# Patient Record
Sex: Female | Born: 1970
Health system: Southern US, Community
[De-identification: ages and names within clinical notes are randomized; demographics above are authoritative.]

## PROBLEM LIST (undated history)

## (undated) ENCOUNTER — Emergency Department (HOSPITAL_COMMUNITY): Payer: Managed Care, Other (non HMO)

## (undated) DIAGNOSIS — J45909 Unspecified asthma, uncomplicated: Secondary | ICD-10-CM

## (undated) HISTORY — PX: HERNIA REPAIR: SHX51

---

## 2000-08-29 ENCOUNTER — Other Ambulatory Visit: Admission: RE | Admit: 2000-08-29 | Discharge: 2000-08-29 | Payer: Self-pay | Admitting: Obstetrics and Gynecology

## 2013-09-23 ENCOUNTER — Emergency Department (HOSPITAL_COMMUNITY)
Admission: EM | Admit: 2013-09-23 | Discharge: 2013-09-24 | Disposition: A | Discharge status: Home / Self Care | Payer: BC Managed Care – PPO | Attending: Emergency Medicine | Admitting: Emergency Medicine

## 2013-09-23 ENCOUNTER — Encounter (HOSPITAL_COMMUNITY): Payer: Self-pay | Admitting: Emergency Medicine

## 2013-09-23 DIAGNOSIS — S161XXA Strain of muscle, fascia and tendon at neck level, initial encounter: Secondary | ICD-10-CM

## 2013-09-23 DIAGNOSIS — Z79899 Other long term (current) drug therapy: Secondary | ICD-10-CM | POA: Insufficient documentation

## 2013-09-23 DIAGNOSIS — S43402A Unspecified sprain of left shoulder joint, initial encounter: Secondary | ICD-10-CM

## 2013-09-23 DIAGNOSIS — J45909 Unspecified asthma, uncomplicated: Secondary | ICD-10-CM | POA: Insufficient documentation

## 2013-09-23 DIAGNOSIS — Z888 Allergy status to other drugs, medicaments and biological substances status: Secondary | ICD-10-CM | POA: Insufficient documentation

## 2013-09-23 DIAGNOSIS — Z91018 Allergy to other foods: Secondary | ICD-10-CM | POA: Insufficient documentation

## 2013-09-23 DIAGNOSIS — Y939 Activity, unspecified: Secondary | ICD-10-CM | POA: Insufficient documentation

## 2013-09-23 DIAGNOSIS — T148XXA Other injury of unspecified body region, initial encounter: Secondary | ICD-10-CM

## 2013-09-23 DIAGNOSIS — S139XXA Sprain of joints and ligaments of unspecified parts of neck, initial encounter: Secondary | ICD-10-CM | POA: Insufficient documentation

## 2013-09-23 DIAGNOSIS — Z91011 Allergy to milk products: Secondary | ICD-10-CM | POA: Insufficient documentation

## 2013-09-23 DIAGNOSIS — IMO0002 Reserved for concepts with insufficient information to code with codable children: Secondary | ICD-10-CM | POA: Insufficient documentation

## 2013-09-23 DIAGNOSIS — M62838 Other muscle spasm: Secondary | ICD-10-CM

## 2013-09-23 HISTORY — DX: Unspecified asthma, uncomplicated: J45.909

## 2013-09-23 MED ORDER — OXYCODONE-ACETAMINOPHEN 5-325 MG PO TABS
1.0000 | ORAL_TABLET | Freq: Once | ORAL | Status: AC
  Administered 2013-09-23: 1 via ORAL
  Filled 2013-09-23: qty 1
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
## 2013-09-24 ENCOUNTER — Emergency Department (HOSPITAL_COMMUNITY): Payer: BC Managed Care – PPO

## 2013-09-24 MED ORDER — DIAZEPAM 2 MG PO TABS
2.0000 mg | ORAL_TABLET | Freq: Once | ORAL | Status: AC
  Administered 2013-09-24: 2 mg via ORAL
  Filled 2013-09-24: qty 1

## 2013-09-24 MED ORDER — CYCLOBENZAPRINE HCL 10 MG PO TABS: 10.0000 mg | ORAL_TABLET | Freq: Two times a day (BID) | ORAL | Status: DC | PRN

## 2013-09-24 MED ORDER — OXYCODONE-ACETAMINOPHEN 5-325 MG PO TABS
1.0000 | ORAL_TABLET | Freq: Once | ORAL | Status: AC
  Administered 2013-09-24: 1 via ORAL
  Filled 2013-09-24: qty 1

## 2013-09-24 MED ORDER — HYDROCODONE-ACETAMINOPHEN 5-325 MG PO TABS: 1.0000 | ORAL_TABLET | ORAL | Status: DC | PRN
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
## 2014-02-03 ENCOUNTER — Other Ambulatory Visit: Payer: Self-pay | Admitting: Obstetrics and Gynecology

## 2014-02-03 DIAGNOSIS — N6320 Unspecified lump in the left breast, unspecified quadrant: Secondary | ICD-10-CM

## 2014-02-03 DIAGNOSIS — R2232 Localized swelling, mass and lump, left upper limb: Secondary | ICD-10-CM

## 2014-02-11 ENCOUNTER — Other Ambulatory Visit: Payer: BC Managed Care – PPO

## 2014-02-21 ENCOUNTER — Ambulatory Visit
Admission: RE | Admit: 2014-02-21 | Discharge: 2014-02-21 | Disposition: A | Discharge status: Home / Self Care | Payer: 59 | Source: Ambulatory Visit | Attending: Obstetrics and Gynecology | Admitting: Obstetrics and Gynecology

## 2014-02-21 DIAGNOSIS — R2232 Localized swelling, mass and lump, left upper limb: Secondary | ICD-10-CM

## 2014-02-21 DIAGNOSIS — N6320 Unspecified lump in the left breast, unspecified quadrant: Secondary | ICD-10-CM

## 2015-08-04 DIAGNOSIS — K4020 Bilateral inguinal hernia, without obstruction or gangrene, not specified as recurrent: Secondary | ICD-10-CM | POA: Insufficient documentation

## 2015-08-04 DIAGNOSIS — J309 Allergic rhinitis, unspecified: Secondary | ICD-10-CM | POA: Insufficient documentation

## 2015-08-04 DIAGNOSIS — R197 Diarrhea, unspecified: Secondary | ICD-10-CM | POA: Diagnosis not present

## 2015-08-04 DIAGNOSIS — J45909 Unspecified asthma, uncomplicated: Secondary | ICD-10-CM | POA: Insufficient documentation

## 2015-08-04 DIAGNOSIS — K429 Umbilical hernia without obstruction or gangrene: Secondary | ICD-10-CM | POA: Insufficient documentation

## 2015-08-05 DIAGNOSIS — R197 Diarrhea, unspecified: Secondary | ICD-10-CM | POA: Diagnosis not present

## 2016-01-10 DIAGNOSIS — J4540 Moderate persistent asthma, uncomplicated: Secondary | ICD-10-CM | POA: Diagnosis not present

## 2016-01-10 DIAGNOSIS — J302 Other seasonal allergic rhinitis: Secondary | ICD-10-CM | POA: Diagnosis not present

## 2016-01-10 MED FILL — VENTOLIN HFA 90 MCG INHALER: 108 (90 BAS | 25 days supply | Qty: 18 | Fill #0

## 2016-01-10 MED FILL — MONTELUKAST SOD 10 MG TAB: 10 | 30 days supply | Qty: 30 | Fill #0

## 2016-03-28 ENCOUNTER — Telehealth: Payer: Self-pay | Admitting: Family Medicine

## 2016-03-28 MED FILL — predniSONE 20 MG TABS: 20 | 7 days supply | Qty: 7 | Fill #0

## 2016-03-28 NOTE — Telephone Encounter (Signed)
Pt called in never seen dr Katrinka Blazingsmith before.  She is [employer] employee and wanted to know if there is anyway she could be worked in.  Her back has went out and barly can put any weight on it.  1st appt Dr Katrinka Blazingsmith has is the 22nd

## 2016-03-29 DIAGNOSIS — M545 Low back pain: Secondary | ICD-10-CM | POA: Diagnosis not present

## 2016-03-29 DIAGNOSIS — M549 Dorsalgia, unspecified: Secondary | ICD-10-CM | POA: Diagnosis not present

## 2016-03-29 MED FILL — CYCLOBENZAPRINE 10 MG TAB: 10 | 10 days supply | Qty: 30 | Fill #0

## 2016-03-29 MED FILL — OXYCODONE W/APAP 5/325 TAB: 5-325 | 5 days supply | Qty: 20 | Fill #0

## 2016-03-29 NOTE — Telephone Encounter (Signed)
Spoke to pt, scheduled her for 1.9.17 @ 1pm.

## 2016-04-01 NOTE — Progress Notes (Signed)
Tawana ScaleZach Haani Bakula D.O. [facility] [address] [place], [place] [address] Phone: [phone] Subjective:    I'm seeing this patient by the request  of:  BURNETT,BRENT A, MD   CC: Low back pain.  ZHY:[id number]:Subjective  Amber Gill is a 46 y.o. female coming in with complaint of low back pain. Been greater than one week. Patient was seen by primary care provider. Patient was found to have more of a strain. Patient was given some exercises, Flexeril as well as Percocet. Patient states it initially happened when she was picking up a laundry basket and twisting. States that she continued to have constant pain. States that she was having radiation to the left leg as well as the left hip. Rates the severity of pain a 6 out of 10. Seem to catch her breath. Patient states Unfortunately the pain continued and only minorly better with the prednisone. Still has the pain. Any type of flexing seems to get a radiating pain down the lateral aspect of the left leg. Denies any weakness. Has been unable to work secondary to the pain. Patient is taking muscle relaxers a regular basis. Denies any bowel or bladder problems, denies any fevers chills or any abnormal weight loss recently.    Patient did have x-rays taken 03/29/2016. These were independently visualized by me. X-ray show no significant bony abnormality.  Past Medical History:  Diagnosis Date  . Asthma    Past Surgical History:  Procedure Laterality Date  . HERNIA REPAIR     Social History   Social History  . Marital status: Single    Spouse name: N/A  . Number of children: N/A  . Years of education: N/A   Social History Main Topics  . Smoking status: Never Smoker  . Smokeless tobacco: None  . Alcohol use Yes  . Drug use: No  . Sexual activity: Not Asked   Other Topics Concern  . None   Social History Narrative  . None   Allergies  Allergen Reactions  . Casein Diarrhea and Other (See Comments)    Eczema   . Gluten Meal  Diarrhea and Other (See Comments)    eczema  . Milk-Related Compounds Diarrhea and Other (See Comments)    eczema  . Nsaids Other (See Comments)    Severe stomach pains  . Oat Other (See Comments)    eczema  . Ceclor [Cefaclor] Rash   No family history on file. No family history of rheumatological diseases.  Past medical history, social, surgical and family history all reviewed in electronic medical record.  No pertanent information unless stated regarding to the chief complaint.   Review of Systems:Review of systems updated and as accurate as of 04/02/16  No headache, visual changes, nausea, vomiting, diarrhea, constipation, dizziness, abdominal pain, skin rash, fevers, chills, night sweats, weight loss, swollen lymph nodes, chest pain, shortness of breath, mood changes.   Objective  Blood pressure 116/80, height 5\' 5"  (1.651 m), weight 160 lb (72.6 kg). Systems examined below as of 04/02/16   General: No apparent distress alert and oriented x3 mood and affect normal, dressed appropriately.  HEENT: Pupils equal, extraocular movements intact  Respiratory: Patient's speak in full sentences and does not appear short of breath  Cardiovascular: No lower extremity edema, non tender, no erythema  Skin: Warm dry intact with no signs of infection or rash on extremities or on axial skeleton.  Abdomen: Soft nontender  Neuro: Cranial nerves II through XII are intact, neurovascularly intact  in all extremities with 2+ DTRs and 2+ pulses.  Lymph: No lymphadenopathy of posterior or anterior cervical chain or axillae bilaterally.  Gait normal with good balance and coordination.  MSK:  Non tender with full range of motion and good stability and symmetric strength and tone of shoulders, elbows, wrist, hip, knee and ankles bilaterally.  Back Exam:  Inspection: Unremarkable  Motion: Flexion 15 deg, Extension 25 deg, Side Bending to 15 deg bilaterally,  Rotation to 15 deg bilaterally  SLR laying:  Severely positive on the left side XSLR laying: Positive with radicular symptoms on the left side Palpable tenderness: . Severe tenderness to palpation and appears palmar musculature around L4-L5 on the left side FABER: Unable to do secondary to pain. Sensory change: Gross sensation intact to all lumbar and sacral dermatomes.  Reflexes: 2+ at both patellar tendons, 2+ at achilles tendons, Babinski's downgoing.  Strength at foot  5 out of 5 and symmetric    Impression and Recommendations:     This case required medical decision making of moderate complexity.      Note: This dictation was prepared with Dragon dictation along with smaller phrase technology. Any transcriptional errors that result from this process are unintentional.

## 2016-04-02 ENCOUNTER — Encounter: Payer: Self-pay | Admitting: *Deleted

## 2016-04-02 ENCOUNTER — Ambulatory Visit (INDEPENDENT_AMBULATORY_CARE_PROVIDER_SITE_OTHER): Payer: 59 | Admitting: Family Medicine

## 2016-04-02 ENCOUNTER — Encounter: Payer: Self-pay | Admitting: Family Medicine

## 2016-04-02 VITALS — BP 116/80 | Ht 65.0 in | Wt 160.0 lb

## 2016-04-02 DIAGNOSIS — M5440 Lumbago with sciatica, unspecified side: Secondary | ICD-10-CM

## 2016-04-02 DIAGNOSIS — M5416 Radiculopathy, lumbar region: Secondary | ICD-10-CM | POA: Diagnosis not present

## 2016-04-02 MED ORDER — METHYLPREDNISOLONE ACETATE 80 MG/ML IJ SUSP
80.0000 mg | Freq: Once | INTRAMUSCULAR | Status: AC
  Administered 2016-04-02: 80 mg via INTRAMUSCULAR

## 2016-04-02 MED ORDER — DIAZEPAM 5 MG PO TABS: 5.0000 mg | ORAL_TABLET | Freq: Two times a day (BID) | ORAL | 0 refills | Status: DC | PRN

## 2016-04-02 MED ORDER — TRAMADOL HCL 50 MG PO TABS: 50.0000 mg | ORAL_TABLET | Freq: Two times a day (BID) | ORAL | 0 refills | Status: DC | PRN

## 2016-04-02 MED ORDER — KETOROLAC TROMETHAMINE 60 MG/2ML IM SOLN
60.0000 mg | Freq: Once | INTRAMUSCULAR | Status: AC
  Administered 2016-04-02: 60 mg via INTRAMUSCULAR

## 2016-04-02 MED ORDER — GABAPENTIN 100 MG PO CAPS: 200.0000 mg | ORAL_CAPSULE | Freq: Every day | ORAL | 3 refills | Status: DC

## 2016-04-02 MED FILL — GABAPENTIN 100 MG CAPSULE: 100 | 30 days supply | Qty: 60 | Fill #0

## 2016-04-02 MED FILL — traMADol HCL 50 MG TABS: 50 | 15 days supply | Qty: 30 | Fill #0

## 2016-04-02 NOTE — Assessment & Plan Note (Signed)
Patient is more of a lumbar radiculopathy. Positive straight leg test noted today. Radicular symptoms noted. Patient does not have any weakness and constant numbness at this point. Patient did not respond externally well to the prednisone. Having difficulty with the muscle relaxer. Given Valium is a new muscle relaxer, gabapentin as well. We will keep her from heavy lifting over the course the next week. Tramadol for pain relief and tell her to discontinue the Percocet. Follow-up again in 1 week. Worsening symptoms I do feel that advance imaging could be warranted. Patient knows if worsening symptoms before visit patient should seek medical attention immediately.

## 2016-04-02 NOTE — Patient Instructions (Addendum)
Good to see you  Ice 20 minutes 2 times daily. Usually after activity and before bed. 2 injections today  Avoid excessive movement.  Gabapentin 200mg  at night Stop the percocet and try tramadol up to 2 times a day with 650mg  of tylenol.  Gabapentin 200mg  at night See me again in 1 week (OK to double book)

## 2016-04-08 NOTE — Progress Notes (Signed)
Amber ScaleZach Melford Gill D.O. [facility] [address] [place], [place] [address] Phone: [phone] Subjective:    I'm seeing this patient by the request  of:  BURNETT,BRENT A, MD   CC: Low back pain f/u   [name] is a 46 y.o. female coming in with complaint of low back pain. Been greater than one week. Patient was seen by primary care provider. Patient was found to have more of a strain. Patient was given some exercises, Flexeril as well as Percocet. Patient was continuing to have radicular symptoms. Patient was put on gabapentin as well as tramadol at last exam. Patient was to start some mild range of motion exercises. Patient states she seems to be doing much better. No radicular symptoms at this time. Still some mild discomfort of the Pierce palmar musculature but improved overall.     Patient did have x-rays taken 03/29/2016. These were independently visualized by me. X-ray show no significant bony abnormality.  Past Medical History:  Diagnosis Date  . Asthma    Past Surgical History:  Procedure Laterality Date  . HERNIA REPAIR     Social History   Social History  . Marital status: Single    Spouse name: N/A  . Number of children: N/A  . Years of education: N/A   Social History Main Topics  . Smoking status: Never Smoker  . Smokeless tobacco: None  . Alcohol use Yes  . Drug use: No  . Sexual activity: Not Asked   Other Topics Concern  . None   Social History Narrative  . None   Allergies  Allergen Reactions  . Casein Diarrhea and Other (See Comments)    Eczema   . Gluten Meal Diarrhea and Other (See Comments)    eczema  . Milk-Related Compounds Diarrhea and Other (See Comments)    eczema  . Nsaids Other (See Comments)    Severe stomach pains  . Oat Other (See Comments)    eczema  . Ceclor [Cefaclor] Rash   No family history on file. No family history of rheumatological diseases.  Past medical history, social, surgical and  family history all reviewed in electronic medical record.  No pertanent information unless stated regarding to the chief complaint.   Review of Systems:Review of systems updated and as accurate as of 04/09/16  No headache, visual changes, nausea, vomiting, diarrhea, constipation, dizziness, abdominal pain, skin rash, fevers, chills, night sweats, weight loss, swollen lymph nodes, chest pain, shortness of breath, mood changes.   Objective  Blood pressure 118/72, pulse 93, height 5\' 5"  (1.651 m), SpO2 98 %. Systems examined below as of 04/09/16   Systems examined below as of 04/09/16 General: NAD A&O x3 mood, affect normal  HEENT: Pupils equal, extraocular movements intact no nystagmus Respiratory: not short of breath at rest or with speaking Cardiovascular: No lower extremity edema, non tender Skin: Warm dry intact with no signs of infection or rash on extremities or on axial skeleton. Abdomen: Soft nontender, no masses Neuro: Cranial nerves  intact, neurovascularly intact in all extremities with 2+ DTRs and 2+ pulses. Lymph: No lymphadenopathy appreciated today  Gait normal with good balance and coordination.  MSK: Non tender with full range of motion and good stability and symmetric strength and tone of shoulders, elbows, wrist,  knee hips and ankles bilaterally.   Back Exam:  Inspection: Unremarkable  Motion: Flexion 45 deg, Extension 25 deg, Side Bending to 45 deg bilaterally  Rotation to 45 deg bilaterally  SLR laying: Negative  XSLR laying: Negative  Palpable tenderness: Tender to palpation of the paraspinal musculature still noted.Marland Kitchen FABER: negative. Sensory change: Gross sensation intact to all lumbar and sacral dermatomes.  Reflexes: 2+ at both patellar tendons, 2+ at achilles tendons, Babinski's downgoing.  Strength at foot  Plantar-flexion: 5/5 Dorsi-flexion: 5/5 Eversion: 5/5 Inversion: 5/5  Leg strength  Quad: 5/5 Hamstring: 5/5 Hip flexor: 5/5 Hip abductors: 4+/5  Gait  unremarkable.  Osteopathic findings Cervical C2 flexed rotated and side bent right C4 flexed rotated and side bent left C6 flexed rotated and side bent left T3 extended rotated and side bent right inhaled third rib T9 extended rotated and side bent left L2 flexed rotated and side bent right Sacrum right on right    Impression and Recommendations:     This case required medical decision making of moderate complexity.      Note: This dictation was prepared with Dragon dictation along with smaller phrase technology. Any transcriptional errors that result from this process are unintentional.

## 2016-04-09 ENCOUNTER — Ambulatory Visit (INDEPENDENT_AMBULATORY_CARE_PROVIDER_SITE_OTHER): Payer: 59 | Admitting: Family Medicine

## 2016-04-09 ENCOUNTER — Encounter: Payer: Self-pay | Admitting: Family Medicine

## 2016-04-09 VITALS — BP 118/72 | HR 93 | Ht 65.0 in

## 2016-04-09 DIAGNOSIS — M999 Biomechanical lesion, unspecified: Secondary | ICD-10-CM | POA: Diagnosis not present

## 2016-04-09 DIAGNOSIS — M5416 Radiculopathy, lumbar region: Secondary | ICD-10-CM | POA: Diagnosis not present

## 2016-04-09 MED ORDER — VITAMIN D (ERGOCALCIFEROL) 1.25 MG (50000 UNIT) PO CAPS: 50000.0000 [IU] | ORAL_CAPSULE | ORAL | 0 refills | Status: AC

## 2016-04-09 MED FILL — VIT D2 1.25 MG (50,000 UNIT: 1.25 MG | 84 days supply | Qty: 12 | Fill #0

## 2016-04-09 NOTE — Assessment & Plan Note (Signed)
Patient is doing much better at this time. We'll continue to monitor. Started on osteopathic manipulation. Patient also referred to formal physical therapy. We discussed icing regimen. Discussed core stabilization. Patient and will come back and see me again in 4-6 weeks.

## 2016-04-09 NOTE — Patient Instructions (Addendum)
Good to see you  Ice is your friend Once weekly vitamin D  We will have PT call you but start Exercises 3 times a week.  See me again in 3-4 weeks.

## 2016-04-09 NOTE — Assessment & Plan Note (Signed)
Decision today to treat with OMT was based on Physical Exam  After verbal consent patient was treated with HVLA, ME,FPr techniques in cervical, thoracic, lumbar and sacral areas  Patient tolerated the procedure well with improvement in symptoms  Patient given exercises, stretches and lifestyle modifications  See medications in patient instructions if given  Patient will follow up in 3-4 weeks

## 2016-04-12 ENCOUNTER — Ambulatory Visit: Payer: 59 | Attending: Family Medicine | Admitting: Physical Therapy

## 2016-04-12 DIAGNOSIS — G8929 Other chronic pain: Secondary | ICD-10-CM | POA: Insufficient documentation

## 2016-04-12 DIAGNOSIS — R293 Abnormal posture: Secondary | ICD-10-CM | POA: Diagnosis not present

## 2016-04-12 DIAGNOSIS — M545 Low back pain: Secondary | ICD-10-CM | POA: Diagnosis not present

## 2016-04-12 DIAGNOSIS — M62830 Muscle spasm of back: Secondary | ICD-10-CM | POA: Diagnosis not present

## 2016-04-12 NOTE — Therapy (Signed)
[facility] [address] [place], [place], [address] Phone: [phone]   Fax:  [phone]  Physical Therapy Evaluation  Patient Details  Name: Amber Gill MRN: [id number] Date of Birth: [birth year] Referring Provider: Antoine PrimasZachary Smith DO  Encounter Date: 04/12/2016      PT End of Session - 04/12/16 1214    Visit Number 1   Number of Visits 13   Date for PT Re-Evaluation 05/24/16   PT Start Time 0930   PT Stop Time 1017   PT Time Calculation (min) 47 min   Activity Tolerance Patient tolerated treatment well   Behavior During Therapy [facility] for tasks assessed/performed      Past Medical History:  Diagnosis Date  . Asthma     Past Surgical History:  Procedure Laterality Date  . HERNIA REPAIR      There were no vitals filed for this visit.       Subjective Assessment - 04/12/16 0936    Subjective Low back pain more to left, Dr said possible herniated disk. Has her back go out about every 2 years. Had diastasis recti with last child, did PT for that. Has had problems with her back ever since, yoga has helped. Happens most often after a walk/hike in the cold. Hamstring tightness on L side this past time seemed to set it off. Sitting causes ache especially at top of femur. Spring 2015 had similar pain, but not nearly as bad. Has been unable to work recently due to pain. First few days radiated down lateral L thigh and some to the front. N/T around coccyx with sitting and putting on R shoe.    Limitations Sitting;Lifting;Standing;Walking;House hold activities   How long can you sit comfortably? 5 minutes   How long can you stand comfortably? 15 minutes   How long can you walk comfortably? "tough to say"   Diagnostic tests x-ray was normal   Patient Stated Goals prevent in the future, get back to work, hiking,    Currently in Pain? Yes   Pain Score 2    Pain Location Back   Pain Orientation Left;Lower   Pain Descriptors /  Indicators Tingling;Heaviness   Pain Type --  subacute on chronic   Pain Radiating Towards was radiating to L thigh now staying around L sacrum    Pain Onset 1 to 4 weeks ago   Pain Frequency Constant   Aggravating Factors  sitting, bending, putting on shoes   Pain Relieving Factors lying on floor with knees elevated, quadruped, in bed with knees elevated    Effect of Pain on Daily Activities since Sunday has been able to do some household activities.             [facility] PT Assessment - 04/12/16 0926      Assessment   Medical Diagnosis Lumbar radiculopathy   Referring Provider Antoine PrimasZachary Smith DO   Onset Date/Surgical Date 03/26/16   Hand Dominance Right   Next MD Visit --  in 2 weeks   Prior Therapy left knee, right ankle, r shoulder, and diastasis recti     Precautions   Precautions --  waiting for FMLA paperwork     Restrictions   Weight Bearing Restrictions No     Balance Screen   Has the patient fallen in the past 6 months No   Has the patient had a decrease in activity level because of a fear of falling?  No   Is the patient reluctant to  leave their home because of a fear of falling?  No     Home Environment   Living Environment Private residence   Living Arrangements Children   Available Help at Discharge Family;Available PRN/intermittently   Type of Home House   Home Access Stairs to enter   Entrance Stairs-Number of Steps 4   Entrance Stairs-Rails None   Home Layout One level   Home Equipment None     Prior Function   Level of Independence Independent   Vocation Full time employment  nurse   Vocation Requirements lifting, squatting, bending   Leisure reading, hiking, backpacking     Cognition   Overall Cognitive Status Within Functional Limits for tasks assessed     Posture/Postural Control   Posture/Postural Control Postural limitations   Postural Limitations Rounded Shoulders;Forward head     ROM / Strength   AROM / PROM / Strength  AROM;PROM;Strength     AROM   AROM Assessment Site Lumbar   Lumbar Flexion 75  with curved, tightness during movement   Lumbar Extension 34   Lumbar - Right Side Bend 22  pain at end range   Lumbar - Left Side Bend 20     Flexibility   Soft Tissue Assessment /Muscle Length yes   Hamstrings no lmitation L felt tighter than R     Palpation   Spinal mobility Lumbar PAIVM WNL   Palpation comment tenderness at the Piriformis on the L, and at the L PSIS, bil lumbar paraspinals spasm with L>R     Special Tests    Special Tests Lumbar;Sacrolliac Tests   Lumbar Tests Slump Test;Prone Knee Bend Test;Straight Leg Raise   Sacroiliac Tests  Gaenslen's Test  gillets (-)and forward flexion testing (+) on L  only     Prone Knee Bend Test   Findings Negative     Straight Leg Raise   Findings Negative     Sacral thrust    Findings Positive     Gaenslen's test   Findings Positive   Side  Left                   [facility] Adult PT Treatment/Exercise - 04/12/16 0926      Manual Therapy   Manual Therapy Joint mobilization   Joint Mobilization L anterior inominate mobilization grade 3-4  pt reported significant improvement in pain                PT Education - 04/12/16 1210    Education provided Yes   Education Details Discussed importance of proper body mechanics with bending and lifting. Educated on anatomy of SIJ using a model for visual. Instructed not to hold stretches for greater than 30 seconds at a time.    Person(s) Educated Patient   Methods Explanation;Verbal cues;Handout   Comprehension Verbalized understanding          PT Short Term Goals - 04/12/16 1234      PT SHORT TERM GOAL #1   Title Patient will require minimal cueing with HEP by 05/03/16   Time 3   Period Weeks   Status New     PT SHORT TERM GOAL #2   Title Patient will improve ROM by >/= 5 degrees in all directions by 05/03/16   Time 3   Period Weeks   Status New     PT SHORT TERM GOAL  #3   Title Patient will be able to verbalize and demo proper body mechanics for lifting and  carrying to prevent and reduce pain and reinjury. (05/03/16)   Time 3   Period Weeks   Status New           PT Long Term Goals - 04/12/16 1238      PT LONG TERM GOAL #1   Title Patient will be independent with HEP by discharge (05/24/16)   Time 6   Period Weeks   Status New     PT LONG TERM GOAL #2   Title Patient will report decrease in pain to 0/10 after working her full shift (05/24/16)   Time 6   Period Weeks     PT LONG TERM GOAL #3   Title Patient will be able to return to recreational activities incuding hiking, backpacking, and camping with </= 1/10 pain. (05/24/16)   Time 6   Period Weeks   Status New     PT LONG TERM GOAL #4   Title During work patient will be able to lift and carry >15-20 # from floor to waist and from waist to above head, as well as push and pull >/= 100# with less than 1/10 pain to promote functional activities. (05/24/16)   Time 6   Period Weeks   Status New               Plan - 04/12/16 1217    Clinical Impression Statement Pt presents as low complexity eval. Pt chose to stand during most of eval as she reported that helped with her pain. With sitting she said she felt some of her radiating symptoms into her L sacrum. Limited back mobility due to spasm and pain. Some positive exam findings indicate possible SIJ involvement. During palpation pt presented with significant tenderness over the L PSIS and L piriformis. Pt is fearful of movement that may increase her pain. Pt reported relief during anterior inominate mobs, but pain was back once they were stopped. Patient would benefit from PT to decrease pain and improve functional mobility.     Rehab Potential Good   PT Frequency 2x / week   PT Duration 6 weeks   PT Treatment/Interventions Cryotherapy;Moist Heat;Therapeutic exercise;Manual techniques;Dry needling;Electrical Stimulation;Iontophoresis 4mg /ml  Dexamethasone;Traction;Ultrasound;Balance training;Energy conservation;Taping   PT Next Visit Plan FOTO, review HEP, anterior inominate mobilizations, HS and Piriformis stretching   PT Home Exercise Plan SLR, pelvic tilts, childs pose, piriformis stretch   Consulted and Agree with Plan of Care Patient      Patient will benefit from skilled therapeutic intervention in order to improve the following deficits and impairments:  Decreased range of motion, Difficulty walking, Increased muscle spasms, Decreased activity tolerance, Pain, Improper body mechanics  Visit Diagnosis: Chronic left-sided low back pain, with sciatica presence unspecified  Muscle spasm of back  Abnormal posture     Problem List Patient Active Problem List   Diagnosis Date Noted  . Nonallopathic lesion of lumbosacral region 04/09/2016  . Nonallopathic lesion of thoracic region 04/09/2016  . Nonallopathic lesion of sacral region 04/09/2016  . Left lumbar radiculopathy 04/02/2016  . Allergic rhinitis 08/04/2015  . Asthma 08/04/2015  . Bilateral inguinal hernia 08/04/2015  . Umbilical hernia without obstruction and without gangrene 08/04/2015    Zada Girt, SPT 04/12/2016, 1:10 PM  [facility] [address] [place], [place], [address] Phone: [phone]   Fax:  [phone]  Name: Amber Gill MRN: [id number] Date of Birth: [birth year]    [name] PT, DPT, LAT, ATC  04/12/16  1:11 PM

## 2016-04-15 ENCOUNTER — Encounter: Payer: 59 | Admitting: Physical Therapy

## 2016-04-15 ENCOUNTER — Telehealth: Payer: Self-pay | Admitting: Physical Therapy

## 2016-04-15 NOTE — Telephone Encounter (Signed)
Called pt and attempted to LVM but mailbox was full.

## 2016-04-19 ENCOUNTER — Ambulatory Visit: Payer: 59 | Admitting: Physical Therapy

## 2016-04-19 DIAGNOSIS — M62830 Muscle spasm of back: Secondary | ICD-10-CM

## 2016-04-19 DIAGNOSIS — M545 Low back pain: Principal | ICD-10-CM

## 2016-04-19 DIAGNOSIS — G8929 Other chronic pain: Secondary | ICD-10-CM

## 2016-04-19 DIAGNOSIS — R293 Abnormal posture: Secondary | ICD-10-CM

## 2016-04-19 NOTE — Patient Instructions (Signed)

## 2016-04-19 NOTE — Therapy (Signed)
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 Asthma     Past Surgical History:  Procedure Laterality Date  . HERNIA REPAIR      There were no vitals filed for this visit.      Subjective Assessment - 04/19/16 1026    Subjective "Im doing better, but work still causes me some issue"   Currently in Pain? Yes   Pain Score 1    Pain Location Back   Pain Orientation Left;Lower   Pain Descriptors / Indicators Sore   Pain Onset 1 to 4 weeks ago   Pain Frequency Intermittent   Aggravating Factors  working, bending forward and reaching.    Pain Relieving Factors laying on the floor, the exercises.                          [facility] Adult PT Treatment/Exercise - 04/19/16 1032      Lumbar Exercises: Stretches   Active Hamstring Stretch 3 reps;30 seconds  contract / relax with 10 sec contraction     Modalities   Modalities Moist Heat     Moist Heat Therapy   Number Minutes Moist Heat 10 Minutes   Moist Heat Location Lumbar Spine  in prone     Manual Therapy   Manual Therapy Myofascial release;Soft tissue mobilization   Joint Mobilization L anterior inominate mobilization grade 3-4   Soft tissue mobilization IASTM over bil lumbar paraspinals   Myofascial Release fasial  stretching/ rolling over the bil paraspinals          Trigger Point Dry Needling - 04/19/16 1049    Consent Given? Yes   Education Handout Provided Yes   Muscles Treated Upper Body Longissimus   Longissimus Response Twitch response elicited;Palpable increased muscle length  lumbar multififuds at L1-L3 bil              PT Education - 04/19/16 1155    Education provided Yes   Education Details anatomy regarding formation or trigger points. what TPDN is, benefits of treatment and what to expect. Postural education regarding proper lifting/ carrying mechanics   Person(s) Educated Patient   Methods Explanation;Verbal cues   Comprehension Verbalized understanding;Verbal cues required          PT Short Term Goals - 04/19/16 1201      PT SHORT TERM GOAL #1   Title Patient will require minimal cueing with HEP by 05/03/16   Time 3   Period Weeks   Status On-going     PT SHORT TERM GOAL #2   Title Patient will improve ROM by >/= 5 degrees in all directions by 05/03/16   Time 3   Period Weeks  Status On-going     PT SHORT TERM GOAL #3   Title Patient will be able to verbalize and demo proper body mechanics for lifting and carrying to prevent and reduce pain and reinjury. (05/03/16)   Time 3   Period Weeks   Status On-going           PT Long Term Goals - 04/12/16 1238      PT LONG TERM GOAL #1   Title Patient will be independent with HEP by discharge (05/24/16)   Time 6   Period Weeks   Status New     PT LONG TERM GOAL #2   Title Patient will report decrease in pain to 0/10 after working her full shift (05/24/16)   Time 6   Period Weeks     PT LONG TERM GOAL #3   Title Patient will be able to return to recreational activities incuding hiking, backpacking, and camping with </= 1/10 pain. (05/24/16)   Time 6   Period Weeks   Status New     PT LONG TERM GOAL #4   Title During work patient will be able to lift and carry >15-20 # from floor to waist and from waist to  above head, as well as push and pull >/= 100# with less than 1/10 pain to promote functional activities. (05/24/16)   Time 6   Period Weeks   Status New               Plan - 04/19/16 1158    Clinical Impression Statement Amber Gill presents 9 minutes late today. Focused on L innominate posterior rotation and lumbar paraspinal spasm. DN was expained and performed on the R lumbar paraspinals followed with soft tissue work. utlized MHP post session to calm down soreness and muscle tightness, she reported decreased pain walking and standing.    PT Next Visit Plan FOTO, review HEP, anterior inominate mobilizations, HS and Piriformis stretching   Consulted and Agree with Plan of Care Patient      Patient will benefit from skilled therapeutic intervention in order to improve the following deficits and impairments:  Decreased range of motion, Difficulty walking, Increased muscle spasms, Decreased activity tolerance, Pain, Improper body mechanics  Visit Diagnosis: Chronic left-sided low back pain, with sciatica presence unspecified  Muscle spasm of back  Abnormal posture     Problem List Patient Active Problem List   Diagnosis Date Noted  . Nonallopathic lesion of lumbosacral region 04/09/2016  . Nonallopathic lesion of thoracic region 04/09/2016  . Nonallopathic lesion of sacral region 04/09/2016  . Left lumbar radiculopathy 04/02/2016  . Allergic rhinitis 08/04/2015  . Asthma 08/04/2015  . Bilateral inguinal hernia 08/04/2015  . Umbilical hernia without obstruction and without gangrene 08/04/2015   [name] PT, DPT, LAT, ATC  04/19/16  12:02 PM      [facility] [address] [place], [place], [address] Phone: [phone]   Fax:  [phone]  Name: Amber Gill MRN: [id number] Date of Birth: 11/17/1970

## 2016-04-24 ENCOUNTER — Ambulatory Visit: Payer: 59 | Admitting: Physical Therapy

## 2016-04-24 DIAGNOSIS — R293 Abnormal posture: Secondary | ICD-10-CM

## 2016-04-24 DIAGNOSIS — M545 Low back pain: Principal | ICD-10-CM

## 2016-04-24 DIAGNOSIS — M62830 Muscle spasm of back: Secondary | ICD-10-CM | POA: Diagnosis not present

## 2016-04-24 DIAGNOSIS — G8929 Other chronic pain: Secondary | ICD-10-CM

## 2016-04-24 NOTE — Therapy (Signed)
[facility] [address] [place], [place], [address] Phone: [phone]   Fax:  [phone]  Physical Therapy Treatment  Patient Details  Name: Amber BoehringerChrislyn J Gill MRN: [id number] Date of Birth: 05-02-1970 Referring Provider: Antoine PrimasZachary Smith DO  Encounter Date: 04/24/2016      PT End of Session - 04/24/16 0931    Visit Number 3   Number of Visits 13   Date for PT Re-Evaluation 05/24/16   PT Start Time 0852   PT Stop Time 0938   PT Time Calculation (min) 46 min   Activity Tolerance Patient tolerated treatment well   Behavior During Therapy [facility] for tasks assessed/performed      Past Medical History:  Diagnosis Date  . Asthma     Past Surgical History:  Procedure Laterality Date  . HERNIA REPAIR      There were no vitals filed for this visit.      Subjective Assessment - 04/24/16 0851    Subjective " I felt pretty sore after the DN in the low back and I felt sore in shoulders but I think it was becuase I tensed up from last session"    Currently in Pain? Yes   Pain Score 2    Pain Location Back   Pain Orientation Lower;Right;Left  L>R   Pain Descriptors / Indicators Sore   Pain Type Chronic pain   Pain Onset 1 to 4 weeks ago   Pain Frequency Intermittent                         [facility] Adult PT Treatment/Exercise - 04/24/16 0001      Lumbar Exercises: Aerobic   Stationary Bike Nu-step L 6 x 6 min  UE/LE      Modalities   Modalities Moist Heat;Electrical Stimulation     Moist Heat Therapy   Number Minutes Moist Heat 6 Minutes   Moist Heat Location Lumbar Spine     Electrical Stimulation   Electrical Stimulation Location low back    Electrical Stimulation Action IFC   Electrical Stimulation Parameters 6 min, L 7.5, 100% scan   Electrical Stimulation Goals Pain     Manual Therapy   Manual Therapy Muscle Energy Technique   Manual therapy comments manual trigger point release over bil upper  traps and L lumbar spine   Joint Mobilization lumbar spine PA mobs grade 3-4, Sacral L superior angle PA mobs   Soft tissue mobilization IASTM over bil lumbar paraspinals   Myofascial Release tack and stretch of the L piriformis   Muscle Energy Technique prone position. resisted L hip flexion with L anterior innominate grade 3 mob holding for 10 sec x 10                  PT Short Term Goals - 04/19/16 1201      PT SHORT TERM GOAL #1   Title Patient will require minimal cueing with HEP by 05/03/16   Time 3   Period Weeks   Status On-going     PT SHORT TERM GOAL #2   Title Patient will improve ROM by >/= 5 degrees in all directions by 05/03/16   Time 3   Period Weeks   Status On-going     PT SHORT TERM GOAL #3   Title Patient will be able to verbalize and demo proper body mechanics for lifting and carrying to prevent and reduce pain and reinjury. (05/03/16)   Time  3   Period Weeks   Status On-going           PT Long Term Goals - 04/12/16 1238      PT LONG TERM GOAL #1   Title Patient will be independent with HEP by discharge (05/24/16)   Time 6   Period Weeks   Status New     PT LONG TERM GOAL #2   Title Patient will report decrease in pain to 0/10 after working her full shift (05/24/16)   Time 6   Period Weeks     PT LONG TERM GOAL #3   Title Patient will be able to return to recreational activities incuding hiking, backpacking, and camping with </= 1/10 pain. (05/24/16)   Time 6   Period Weeks   Status New     PT LONG TERM GOAL #4   Title During work patient will be able to lift and carry >15-20 # from floor to waist and from waist to above head, as well as push and pull >/= 100# with less than 1/10 pain to promote functional activities. (05/24/16)   Time 6   Period Weeks   Status New               Plan - 04/24/16 0932    Clinical Impression Statement Amber Gill reported some relief from the Dn but opted not to have it done today. Focused on mobs to calm  down tightness and myofasical techniqeus to calm down pain and tightness.    PT Next Visit Plan  review HEP, anterior inominate mobilizations, HS and Piriformis stretching, assess prone instability due to hypermobility.   PT Home Exercise Plan SLR, pelvic tilts, childs pose, piriformis stretch      Patient will benefit from skilled therapeutic intervention in order to improve the following deficits and impairments:  Decreased range of motion, Difficulty walking, Increased muscle spasms, Decreased activity tolerance, Pain, Improper body mechanics  Visit Diagnosis: Chronic left-sided low back pain, with sciatica presence unspecified  Muscle spasm of back  Abnormal posture     Problem List Patient Active Problem List   Diagnosis Date Noted  . Nonallopathic lesion of lumbosacral region 04/09/2016  . Nonallopathic lesion of thoracic region 04/09/2016  . Nonallopathic lesion of sacral region 04/09/2016  . Left lumbar radiculopathy 04/02/2016  . Allergic rhinitis 08/04/2015  . Asthma 08/04/2015  . Bilateral inguinal hernia 08/04/2015  . Umbilical hernia without obstruction and without gangrene 08/04/2015    Amber Gill PT, DPT, LAT, ATC  04/24/16  9:36 AM      [facility] [address] [place], [place], [address] Phone: [phone]   Fax:  [phone]  Name: Amber Gill MRN: [id number] Date of Birth: 17-Dec-1970

## 2016-04-25 ENCOUNTER — Ambulatory Visit: Payer: 59 | Admitting: Physical Therapy

## 2016-04-26 ENCOUNTER — Encounter: Payer: 59 | Admitting: Physical Therapy

## 2016-04-29 ENCOUNTER — Encounter: Payer: Self-pay | Admitting: Physical Therapy

## 2016-04-29 ENCOUNTER — Ambulatory Visit: Payer: 59 | Attending: Family Medicine | Admitting: Physical Therapy

## 2016-04-29 ENCOUNTER — Ambulatory Visit: Payer: 59 | Admitting: Physical Therapy

## 2016-04-29 DIAGNOSIS — M62830 Muscle spasm of back: Secondary | ICD-10-CM | POA: Insufficient documentation

## 2016-04-29 DIAGNOSIS — M545 Low back pain: Secondary | ICD-10-CM | POA: Diagnosis not present

## 2016-04-29 DIAGNOSIS — R293 Abnormal posture: Secondary | ICD-10-CM | POA: Diagnosis not present

## 2016-04-29 DIAGNOSIS — G8929 Other chronic pain: Secondary | ICD-10-CM | POA: Diagnosis not present

## 2016-04-29 NOTE — Therapy (Signed)
[facility] [address] [place], [place], [address] Phone: [phone]   Fax:  [phone]  Physical Therapy Treatment  Patient Details  Name: Amber Gill MRN: [id number] Date of Birth: September 11, 1970 Referring Provider: Antoine Primas DO  Encounter Date: 04/29/2016      PT End of Session - 04/29/16 1632    Visit Number 4   Number of Visits 13   Date for PT Re-Evaluation 05/24/16   PT Start Time 1632   PT Stop Time 1718   PT Time Calculation (min) 46 min   Activity Tolerance Patient tolerated treatment well   Behavior During Therapy [facility] for tasks assessed/performed      Past Medical History:  Diagnosis Date  . Asthma     Past Surgical History:  Procedure Laterality Date  . HERNIA REPAIR      There were no vitals filed for this visit.      Subjective Assessment - 04/29/16 1632    Subjective aching across low back. Deep tissue massage after last visit which helped but pain moves between R and L SIJ.    Currently in Pain? Yes   Pain Score 3    Pain Location Back   Pain Orientation Lower   Pain Descriptors / Indicators Aching                        [facility] Adult PT Treatment/Exercise - 04/29/16 0001      Therapeutic Activites    Therapeutic Activities Work Counselling psychologist   Work Simulation patient mobility/handling     Exercises   Exercises Lumbar     Lumbar Exercises: Stretches   Prone Mid Back Stretch Limitations pull off for bilat low back stretch     Lumbar Exercises: Seated   Other Seated Lumbar Exercises pelvic tilt with bouncing on ball chair     Lumbar Exercises: Supine   AB Set Limitations pelvic tilt, tilt with ball squeeze, ball squeeze in table top     Lumbar Exercises: Prone   Other Prone Lumbar Exercises prone pelvic tilt & glut set     Lumbar Exercises: Quadruped   Other Quadruped Lumbar Exercises knee/elbow planks     Manual Therapy   Manual Therapy Taping   Kinesiotex  Facilitate Muscle     Kinesiotix   Facilitate Muscle  X over bilat SIJ                PT Education - 04/29/16 1725    Education provided Yes   Education Details exercise form/rationale, HEP, work Arts development officer) Educated Patient   Methods Explanation;Demonstration;Tactile cues;Verbal cues   Comprehension Verbalized understanding;Returned demonstration;Verbal cues required;Tactile cues required;Need further instruction          PT Short Term Goals - 04/19/16 1201      PT SHORT TERM GOAL #1   Title Patient will require minimal cueing with HEP by 05/03/16   Time 3   Period Weeks   Status On-going     PT SHORT TERM GOAL #2   Title Patient will improve ROM by >/= 5 degrees in all directions by 05/03/16   Time 3   Period Weeks   Status On-going     PT SHORT TERM GOAL #3   Title Patient will be able to verbalize and demo proper body mechanics for lifting and carrying to prevent and reduce pain and reinjury. (05/03/16)   Time 3   Period Weeks   Status  On-going           PT Long Term Goals - 04/12/16 1238      PT LONG TERM GOAL #1   Title Patient will be independent with HEP by discharge (05/24/16)   Time 6   Period Weeks   Status New     PT LONG TERM GOAL #2   Title Patient will report decrease in pain to 0/10 after working her full shift (05/24/16)   Time 6   Period Weeks     PT LONG TERM GOAL #3   Title Patient will be able to return to recreational activities incuding hiking, backpacking, and camping with </= 1/10 pain. (05/24/16)   Time 6   Period Weeks   Status New     PT LONG TERM GOAL #4   Title During work patient will be able to lift and carry >15-20 # from floor to waist and from waist to above head, as well as push and pull >/= 100# with less than 1/10 pain to promote functional activities. (05/24/16)   Time 6   Period Weeks   Status New               Plan - 04/29/16 1720    Clinical Impression Statement Pt unable to attain position of  testing for instability due to pain. Pt able to localize pain to bilateral SIJ. Decreased pain with activation of core and gluts to increase stability. Reviewed ergonomics for work specifiic activities today which pt was able to demonstrate without increased pain.    PT Next Visit Plan tried ergonomics at work? innominate stability   PT Home Exercise Plan SLR, pelvic tilts, childs pose, piriformis stretch   Consulted and Agree with Plan of Care Patient      Patient will benefit from skilled therapeutic intervention in order to improve the following deficits and impairments:     Visit Diagnosis: Chronic left-sided low back pain, with sciatica presence unspecified  Abnormal posture  Muscle spasm of back     Problem List Patient Active Problem List   Diagnosis Date Noted  . Nonallopathic lesion of lumbosacral region 04/09/2016  . Nonallopathic lesion of thoracic region 04/09/2016  . Nonallopathic lesion of sacral region 04/09/2016  . Left lumbar radiculopathy 04/02/2016  . Allergic rhinitis 08/04/2015  . Asthma 08/04/2015  . Bilateral inguinal hernia 08/04/2015  . Umbilical hernia without obstruction and without gangrene 08/04/2015    Cathy Ropp C. Reneisha Stilley PT, DPT 04/29/16 5:28 PM   [facility] [address] [place], [place], [address] Phone: [phone]   Fax:  [phone]  Name: Amber Gill MRN: [id number] Date of Birth: [birth year]

## 2016-04-30 NOTE — Progress Notes (Signed)
Tawana ScaleZach Elijahjames Fuelling D.O. [facility] [address] [place], [place] [address] Phone: [phone] Subjective:    I'm seeing this patient by the request  of:  BURNETT,BRENT A, MD   CC: Low back pain f/u   QIH:[id number]:Subjective  Naiomi Aura FeyJ Gill is a 46 y.o. female coming in with complaint of low back pain. Been greater than one week. Patient was seen by primary care provider. Patient was found to have more of a strain.  Patient saw me and had more bone lumbar radicular symptoms. Patient did respond very well to time, physical therapy as well as manipulation. States that she is feeling approximately 90% better. No radiation of pain. Still seems to be localized in the lower back. Mild increasing upper back pain but very mild compared to what it was previously.     Patient did have x-rays taken 03/29/2016. These were independently visualized by me. X-ray show no significant bony abnormality.  Past Medical History:  Diagnosis Date  . Asthma    Past Surgical History:  Procedure Laterality Date  . HERNIA REPAIR     Social History   Social History  . Marital status: Single    Spouse name: N/A  . Number of children: N/A  . Years of education: N/A   Social History Main Topics  . Smoking status: Never Smoker  . Smokeless tobacco: Never Used  . Alcohol use Yes  . Drug use: No  . Sexual activity: Not Asked   Other Topics Concern  . None   Social History Narrative  . None   Allergies  Allergen Reactions  . Casein Diarrhea and Other (See Comments)    Eczema   . Gluten Meal Diarrhea and Other (See Comments)    eczema  . Milk-Related Compounds Diarrhea and Other (See Comments)    eczema  . Nsaids Other (See Comments)    Severe stomach pains  . Oat Other (See Comments)    eczema  . Ceclor [Cefaclor] Rash   No family history on file. No family history of rheumatological diseases.  Past medical history, social, surgical and family history all reviewed in electronic medical  record.  No pertanent information unless stated regarding to the chief complaint.   Review of Systems: No headache, visual changes, nausea, vomiting, diarrhea, constipation, dizziness, abdominal pain, skin rash, fevers, chills, night sweats, weight loss, swollen lymph nodes, body aches, joint swelling, muscle aches, chest pain, shortness of breath, mood changes.    Objective  Blood pressure 112/78, pulse 78, height 5' 4.5" (1.638 m), weight 161 lb (73 kg).   Systems examined below as of 05/01/16 General: NAD A&O x3 mood, affect normal  HEENT: Pupils equal, extraocular movements intact no nystagmus Respiratory: not short of breath at rest or with speaking Cardiovascular: No lower extremity edema, non tender Skin: Warm dry intact with no signs of infection or rash on extremities or on axial skeleton. Abdomen: Soft nontender, no masses Neuro: Cranial nerves  intact, neurovascularly intact in all extremities with 2+ DTRs and 2+ pulses. Lymph: No lymphadenopathy appreciated today  Gait normal with good balance and coordination.  MSK: Non tender with full range of motion and good stability and symmetric strength and tone of shoulders, elbows, wrist,  knee hips and ankles bilaterally.   Back Exam:  Inspection: Unremarkable  Motion: Flexion 40 deg, Extension 25 deg, Side Bending to 40 deg bilaterally  Rotation to 40 deg bilaterally  SLR laying: Negative  XSLR laying: Negative  Palpable tenderness: Mild tenderness  to more the thoracolumbar junction today.+ FABER: negative. Sensory change: Gross sensation intact to all lumbar and sacral dermatomes.  Reflexes: 2+ at both patellar tendons, 2+ at achilles tendons, Babinski's downgoing.  Strength at foot  Plantar-flexion: 5/5 Dorsi-flexion: 5/5 Eversion: 5/5 Inversion: 5/5  Leg strength  Quad: 5/5 Hamstring: 5/5 Hip flexor: 5/5 Hip abductors: 4++/5  Gait unremarkable.  Osteopathic findings Cervical C2 flexed rotated and side bent right T3  extended rotated and side bent right inhaled third rib T6 extended rotated and side bent left L3 flexed rotated and side bent right Sacrum right on right     Impression and Recommendations:     This case required medical decision making of moderate complexity.      Note: This dictation was prepared with Dragon dictation along with smaller phrase technology. Any transcriptional errors that result from this process are unintentional.

## 2016-05-01 ENCOUNTER — Ambulatory Visit (INDEPENDENT_AMBULATORY_CARE_PROVIDER_SITE_OTHER): Payer: 59 | Admitting: Family Medicine

## 2016-05-01 ENCOUNTER — Encounter: Payer: Self-pay | Admitting: Family Medicine

## 2016-05-01 ENCOUNTER — Ambulatory Visit: Payer: 59 | Admitting: Physical Therapy

## 2016-05-01 VITALS — BP 112/78 | HR 78 | Ht 64.5 in | Wt 161.0 lb

## 2016-05-01 DIAGNOSIS — M5416 Radiculopathy, lumbar region: Secondary | ICD-10-CM | POA: Diagnosis not present

## 2016-05-01 DIAGNOSIS — M999 Biomechanical lesion, unspecified: Secondary | ICD-10-CM | POA: Diagnosis not present

## 2016-05-01 DIAGNOSIS — G8929 Other chronic pain: Secondary | ICD-10-CM | POA: Diagnosis not present

## 2016-05-01 DIAGNOSIS — R293 Abnormal posture: Secondary | ICD-10-CM

## 2016-05-01 DIAGNOSIS — M545 Low back pain: Principal | ICD-10-CM

## 2016-05-01 DIAGNOSIS — M62830 Muscle spasm of back: Secondary | ICD-10-CM

## 2016-05-01 NOTE — Assessment & Plan Note (Signed)
Decision today to treat with OMT was based on Physical Exam  After verbal consent patient was treated with HVLA, ME, FPR techniques in cervical, thoracic, lumbar and sacral areas  Patient tolerated the procedure well with improvement in symptoms  Patient given exercises, stretches and lifestyle modifications  See medications in patient instructions if given  Patient will follow up in 6-7 weeks 

## 2016-05-01 NOTE — Patient Instructions (Signed)
God to see you  I am impressed  Keep it up  Ice is your friend  Try to do my exercises 2-3 times a week See me again in 6-7 weeks.

## 2016-05-01 NOTE — Therapy (Addendum)
[place], [place], [address] Phone: [phone]   Fax:  [phone]  Physical Therapy Treatment / Discharge Note  Patient Details  Name: ATHINA FAHEY MRN: [id number] Date of Birth: 07/30/1970 Referring Provider: Hulan Saas DO  Encounter Date: 05/01/2016      PT End of Session - 05/01/16 1155    Visit Number 5   Number of Visits 13   Date for PT Re-Evaluation 05/24/16   PT Start Time 1017   PT Stop Time 1101   PT Time Calculation (min) 44 min   Activity Tolerance Patient tolerated treatment well   Behavior During Therapy [facility] for tasks assessed/performed      Past Medical History:  Diagnosis Date  . Asthma     Past Surgical History:  Procedure Laterality Date  . HERNIA REPAIR      There were no vitals filed for this visit.      Subjective Assessment - 05/01/16 1020    Subjective "I did try some of the techniques learned last session and it was helpful. did see Md today and he manipulated my back"   Currently in Pain? Yes   Pain Score 1    Pain Location Back                         [facility] Adult PT Treatment/Exercise - 05/01/16 0001      Lumbar Exercises: Stretches   Active Hamstring Stretch 2 reps;30 seconds  with strap, performed    Single Knee to Chest Stretch 2 reps;30 seconds   Lower Trunk Rotation 2 reps;30 seconds     Lumbar Exercises: Aerobic   Stationary Bike L4 x 5 min     Lumbar Exercises: Supine   Other Supine Lumbar Exercises table top position 2 x 10 with alternating LE extension   Other Supine Lumbar Exercises laying on bolster perofrming marching 2 x 10  difficulty maintaining pos wihtout UE support     Lumbar Exercises: Prone   Opposite Arm/Leg Raise Right arm/Left leg;Left arm/Right leg;10 reps  x 1 set in prone   Other Prone Lumbar Exercises prone pelvic tilt & glut set 1 x 10     Lumbar Exercises: Quadruped   Other Quadruped Lumbar  Exercises hip/ back disociating 1 x 10  while wearing SI belt, and verbal/ tactile cues on posture                PT Education - 05/01/16 1155    Education provided Yes   Education Details benefits of the SI belt, and continuing with core strengthening.    Person(s) Educated Patient   Methods Explanation;Verbal cues   Comprehension Verbalized understanding;Verbal cues required          PT Short Term Goals - 04/19/16 1201      PT SHORT TERM GOAL #1   Title Patient will require minimal cueing with HEP by 05/03/16   Time 3   Period Weeks   Status On-going     PT SHORT TERM GOAL #2   Title Patient will improve ROM by >/= 5 degrees in all directions by 05/03/16   Time 3   Period Weeks   Status On-going     PT SHORT TERM GOAL #3   Title Patient will be able to verbalize and demo proper body mechanics for lifting and carrying to prevent and reduce pain and reinjury. (05/03/16)   Time 3   Period  Weeks   Status On-going           PT Long Term Goals - 04/12/16 1238      PT LONG TERM GOAL #1   Title Patient will be independent with HEP by discharge (05/24/16)   Time 6   Period Weeks   Status New     PT LONG TERM GOAL #2   Title Patient will report decrease in pain to 0/10 after working her full shift (05/24/16)   Time 6   Period Weeks     PT LONG TERM GOAL #3   Title Patient will be able to return to recreational activities incuding hiking, backpacking, and camping with </= 1/10 pain. (05/24/16)   Time 6   Period Weeks   Status New     PT LONG TERM GOAL #4   Title During work patient will be able to lift and carry >15-20 # from floor to waist and from waist to above head, as well as push and pull >/= 100# with less than 1/10 pain to promote functional activities. (05/24/16)   Time 6   Period Weeks   Status New               Plan - 05/01/16 1156    Clinical Impression Statement Mrs. Huizar reports she has decreased pain today and had tried the biomechanics with  lifting/ pushing pts and reported decreased pain. Focused on core strenghening and hp/ low back disociation exericse. She reported th eSI belt helped calm down the pain throughtout activities and in sitting. She reports that her MD stated she didn't need to come to PT but she wants to drop down to 1 x a week for the reaminder of her visits.    PT Treatment/Interventions Cryotherapy;Moist Heat;Therapeutic exercise;Manual techniques;Dry needling;Electrical Stimulation;Iontophoresis 94m/ml Dexamethasone;Traction;Ultrasound;Balance training;Energy conservation;Taping   PT Next Visit Plan tried ergonomics at work? innominate stability, hip/low back disociations, did pt get a SI belt, core strengthening,    PT Home Exercise Plan SLR, pelvic tilts, childs pose, piriformis stretch   Consulted and Agree with Plan of Care Patient      Patient will benefit from skilled therapeutic intervention in order to improve the following deficits and impairments:  Decreased range of motion, Difficulty walking, Increased muscle spasms, Decreased activity tolerance, Pain, Improper body mechanics  Visit Diagnosis: Chronic left-sided low back pain, with sciatica presence unspecified  Abnormal posture  Muscle spasm of back     Problem List Patient Active Problem List   Diagnosis Date Noted  . Nonallopathic lesion of lumbosacral region 04/09/2016  . Nonallopathic lesion of thoracic region 04/09/2016  . Nonallopathic lesion of sacral region 04/09/2016  . Left lumbar radiculopathy 04/02/2016  . Allergic rhinitis 08/04/2015  . Asthma 08/04/2015  . Bilateral inguinal hernia 08/04/2015  . Umbilical hernia without obstruction and without gangrene 08/04/2015   KStarr LakePT, DPT, LAT, ATC  05/01/16  12:01 PM      [facility] [address]Phone: [phone]  Fax:  [phone] Name: CSAKINA BRIONESMRN: [id number]  of Birth: [birth year]    PHYSICAL THERAPY DISCHARGE SUMMARY  Visits from Start of Care: 5  Current functional level related to goals / functional outcomes: See goals   Remaining deficits: unknow   Education / Equipment: HEP  Plan: Patient agrees to discharge.  Patient goals were partially met. Patient is being discharged due to not returning since the last visit.  ?????  Deiondra Denley PT, DPT, LAT, ATC  05/28/16  1:49 PM

## 2016-05-01 NOTE — Assessment & Plan Note (Signed)
No radicular symptoms at this time. 90% better at this time. Has muscle relaxer interment over breaks or pain if needed. Encourage her to continue with the vitamin D supplementation. We will decrease frequency of office visits to 6-7 week intervals.

## 2016-05-06 ENCOUNTER — Ambulatory Visit: Payer: 59 | Admitting: Physical Therapy

## 2016-05-09 ENCOUNTER — Ambulatory Visit: Payer: 59 | Admitting: Physical Therapy

## 2016-05-10 ENCOUNTER — Encounter: Payer: 59 | Admitting: Physical Therapy

## 2016-05-14 ENCOUNTER — Ambulatory Visit: Payer: 59 | Admitting: Physical Therapy

## 2016-05-27 DIAGNOSIS — J22 Unspecified acute lower respiratory infection: Secondary | ICD-10-CM | POA: Diagnosis not present

## 2016-05-27 DIAGNOSIS — J101 Influenza due to other identified influenza virus with other respiratory manifestations: Secondary | ICD-10-CM | POA: Diagnosis not present

## 2016-05-27 DIAGNOSIS — R509 Fever, unspecified: Secondary | ICD-10-CM | POA: Diagnosis not present

## 2016-09-16 ENCOUNTER — Encounter: Payer: Self-pay | Admitting: Sports Medicine

## 2016-09-16 ENCOUNTER — Encounter: Payer: 59 | Admitting: Sports Medicine

## 2016-09-16 ENCOUNTER — Telehealth: Payer: Self-pay | Admitting: Family Medicine

## 2016-09-16 NOTE — Progress Notes (Deleted)
  OFFICE VISIT NOTE Veverly FellsMichael D. Delorise Shinerigby, DO  [facility] [place] [phone]  Amber Aura FeyJ Gill - 46 y.o. female MRN [id number]  Date of birth: 1970-07-16  Visit Date: 09/16/2016  PCP: Delorse LekBurnett, Brent A, MD   Referred by: Delorse LekBurnett, Brent A, MD  Clovis CaoAutumn McNeil, cma acting as scribe for Dr. Berline Choughigby.  SUBJECTIVE:   Chief Complaint  Patient presents with  . New Patient (Initial Visit)  . Back Pain   HPI: As below and per problem based documentation when appropriate.  HPI  ROS  Otherwise per HPI.  HISTORY & PERTINENT PRIOR DATA:  No specialty comments available. She reports that she has never smoked. She has never used smokeless tobacco. No results for input(s): HGBA1C, LABURIC in the last 8760 hours. Medications & Allergies reviewed per EMR Patient Active Problem List   Diagnosis Date Noted  . Nonallopathic lesion of lumbosacral region 04/09/2016  . Nonallopathic lesion of thoracic region 04/09/2016  . Nonallopathic lesion of sacral region 04/09/2016  . Left lumbar radiculopathy 04/02/2016  . Allergic rhinitis 08/04/2015  . Asthma 08/04/2015  . Bilateral inguinal hernia 08/04/2015  . Umbilical hernia without obstruction and without gangrene 08/04/2015   Past Medical History:  Diagnosis Date  . Asthma    History reviewed. No pertinent family history. Past Surgical History:  Procedure Laterality Date  . HERNIA REPAIR     Social History   Occupational History  . Not on file.   Social History Main Topics  . Smoking status: Never Smoker  . Smokeless tobacco: Never Used  . Alcohol use Yes  . Drug use: No  . Sexual activity: Not on file    OBJECTIVE:  VS:  HT:    WT:   BMI:     BP:   HR: bpm  TEMP: ( )  RESP:  EXAM: No additional findings.    No results found. ASSESSMENT & PLAN:  { }

## 2016-09-16 NOTE — Telephone Encounter (Signed)
Patient states her back went out over 2 weeks. She did everything you advise her to before. It started to get better, but now it is much worse. She states she is having a hard time walking or bending over. 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 She still wanted to see if you could see her or advise her on what to do. Thank you.

## 2016-09-16 NOTE — Telephone Encounter (Signed)
Pt scheduled with Berline Choughigby at 230 today.

## 2016-09-16 NOTE — Telephone Encounter (Signed)
Awaiting Dr. Janeece Riggersigby's arrival at the clinic this afternoon to ask for approval of trying to fit patient into his schedule. 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

## 2016-09-17 ENCOUNTER — Other Ambulatory Visit: Payer: Self-pay

## 2016-09-17 ENCOUNTER — Telehealth: Payer: Self-pay | Admitting: Sports Medicine

## 2016-09-17 ENCOUNTER — Encounter: Payer: Self-pay | Admitting: Sports Medicine

## 2016-09-17 ENCOUNTER — Ambulatory Visit (INDEPENDENT_AMBULATORY_CARE_PROVIDER_SITE_OTHER): Payer: 59 | Admitting: Sports Medicine

## 2016-09-17 VITALS — BP 100/74 | HR 96 | Wt 158.8 lb

## 2016-09-17 DIAGNOSIS — G8929 Other chronic pain: Secondary | ICD-10-CM | POA: Insufficient documentation

## 2016-09-17 DIAGNOSIS — M5441 Lumbago with sciatica, right side: Secondary | ICD-10-CM

## 2016-09-17 MED ORDER — KETOROLAC TROMETHAMINE 60 MG/2ML IM SOLN
60.0000 mg | Freq: Once | INTRAMUSCULAR | Status: AC
  Administered 2016-09-17: 60 mg via INTRAMUSCULAR

## 2016-09-17 MED ORDER — METHYLPREDNISOLONE ACETATE 80 MG/ML IJ SUSP
80.0000 mg | Freq: Once | INTRAMUSCULAR | Status: AC
  Administered 2016-09-17: 80 mg via INTRAMUSCULAR

## 2016-09-17 MED ORDER — METHYLPREDNISOLONE 4 MG PO TBPK: ORAL_TABLET | ORAL | 0 refills | Status: DC

## 2016-09-17 MED ORDER — DIAZEPAM 5 MG PO TABS: 5.0000 mg | ORAL_TABLET | Freq: Three times a day (TID) | ORAL | 0 refills | Status: DC | PRN

## 2016-09-17 NOTE — Progress Notes (Signed)
OFFICE VISIT NOTE Veverly FellsMichael D. Delorise Shinerigby, DO  [facility] [id number]  Kem Aura FeyJ Ocampo - 46 y.o. female MRN [id number]  Date of birth: [birth year]  Visit Date: 09/17/2016  PCP: Delorse LekBurnett, Brent A, MD   Referred by: Delorse LekBurnett, Brent A, MD Clovis CaoAutumn McNeil, cma acting as scribe for Dr. Berline Choughigby.  SUBJECTIVE:   Chief Complaint  Patient presents with  . New Patient (Initial Visit)  . Back Pain   HPI: As below and per problem based documentation when appropriate.  Amber Gill reports recurrent lower back pain x2 weeks. Pt was leaning over hooking up her garden hose when pain was triggered. Taking cyclobenzaprine and percet prn only with some relief. Got a massage last Friday which worsened the pain. Location is middle lower back that radiates around to the right and left hip. There is a constant bruise feeling with sharp shooting pain.  Previously seen with these similar sx and did get relief with OMT. Previous injury in 2005, fell down stairs and has multiple issues with her back since. 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 The muscle relaxer's do help. There is some nausea with pain.  Previous xrays on sacrum and lumbar spine.    ROS  Otherwise per HPI.  HISTORY & PERTINENT PRIOR DATA:  No specialty comments available. She reports that she has never smoked. She has never used smokeless tobacco. No results for input(s): HGBA1C, LABURIC in the last 8760 hours. Medications & Allergies reviewed per EMR Patient Active Problem List   Diagnosis Date Noted  . Chronic bilateral low back pain with right-sided sciatica 09/17/2016  . Nonallopathic lesion of lumbosacral region 04/09/2016  . Nonallopathic lesion of thoracic region 04/09/2016  . Nonallopathic lesion of sacral region 04/09/2016  . Left lumbar radiculopathy 04/02/2016  . Allergic rhinitis 08/04/2015  . Asthma 08/04/2015  . Bilateral inguinal hernia 08/04/2015  . Umbilical  hernia without obstruction and without gangrene 08/04/2015   Past Medical History:  Diagnosis Date  . Asthma    History reviewed. No pertinent family history. Past Surgical History:  Procedure Laterality Date  . HERNIA REPAIR     Social History   Occupational History  . Not on file.   Social History Main Topics  . Smoking status: Never Smoker  . Smokeless tobacco: Never Used  . Alcohol use Yes  . Drug use: No  . Sexual activity: Not on file    OBJECTIVE:  VS:  HT:    WT:158 lb 12.8 oz (72 kg)  BMI:     BP:100/74  HR:96bpm  TEMP: ( )  RESP:99 % EXAM: Findings:  Patient is hardly audible.  She stands the entirety of the exam.  She is able to heel and toe walk without significant problems there is a markedly positive straight leg raise as well as significant TTP over the bilateral paraspinal musculature.  She has pain-free internal/external rotation of the hips but only in a limited arc bilaterally.  Lower extremity sensation is slightly diminished in the right L5 distribution.   She is no respiratory distress.  She is nontoxic appearing.     No results found. ASSESSMENT & PLAN:   Problem List Items Addressed This Visit    Chronic bilateral low back pain with right-sided sciatica - Primary    Patient has had normal x-rays previously.  Marked pain today with hyperesthesia over the lower lumbar spine.  The seem most consistent with axial pain however given the slight  dysesthesia concern for potential underlying radiculitis.  No red flag symptoms present.  Injection today as well as change in muscle relaxer to Valium as needed.  We will plan to obtain an MRI as well and follow-up with her after this is obtained to discuss further options including possibility of epidural steroid injection.  She is not interested in gabapentinoids today she has had prior intolerances to this      Relevant Medications   diazepam (VALIUM) 5 MG tablet   ketorolac (TORADOL) injection 60 mg  (Completed)   methylPREDNISolone acetate (DEPO-MEDROL) injection 80 mg (Completed)   Other Relevant Orders   MR Lumbar Spine Wo Contrast      Follow-up: Return for MRI review.   CMA/ATC served as Neurosurgeon during this visit. History, Physical, and Plan performed by medical provider. Documentation and orders reviewed and attested to.      Amber Bidding, DO    [facility]

## 2016-09-17 NOTE — Telephone Encounter (Signed)
Please advise on specifics of this work note. Thanks.

## 2016-09-17 NOTE — Assessment & Plan Note (Signed)
Patient has had normal x-rays previously.  Marked pain today with hyperesthesia over the lower lumbar spine.  The seem most consistent with axial pain however given the slight dysesthesia concern for potential underlying radiculitis.  No red flag symptoms present.  Injection today as well as change in muscle relaxer to Valium as needed.  We will plan to obtain an MRI as well and follow-up with her after this is obtained to discuss further options including possibility of epidural steroid injection.  She is not interested in gabapentinoids today she has had prior intolerances to this

## 2016-09-17 NOTE — Telephone Encounter (Signed)
Patient called to to check on out of work note that she was supposed to receive. Please mail to the patient at her home address when completed

## 2016-09-18 NOTE — Telephone Encounter (Signed)
Letter has been mailed. Please contact pt in the morning and advise. Thanks!

## 2016-09-18 NOTE — Telephone Encounter (Signed)
Okay to return on Monday without restrictions.

## 2016-09-20 NOTE — Telephone Encounter (Signed)
Called [phone] and left VM advising OK to return to work Monday with no restrictions and that letter has been mailed.

## 2016-10-02 ENCOUNTER — Ambulatory Visit
Admission: RE | Admit: 2016-10-02 | Discharge: 2016-10-02 | Disposition: A | Discharge status: Home / Self Care | Payer: 59 | Source: Ambulatory Visit | Attending: Sports Medicine | Admitting: Sports Medicine

## 2016-10-02 DIAGNOSIS — G8929 Other chronic pain: Secondary | ICD-10-CM

## 2016-10-02 DIAGNOSIS — M5126 Other intervertebral disc displacement, lumbar region: Secondary | ICD-10-CM | POA: Diagnosis not present

## 2016-10-02 DIAGNOSIS — M5441 Lumbago with sciatica, right side: Principal | ICD-10-CM

## 2016-10-08 ENCOUNTER — Encounter: Payer: Self-pay | Admitting: Sports Medicine

## 2016-10-08 ENCOUNTER — Ambulatory Visit (INDEPENDENT_AMBULATORY_CARE_PROVIDER_SITE_OTHER): Payer: 59 | Admitting: Sports Medicine

## 2016-10-08 VITALS — BP 110/80 | HR 90 | Ht 64.5 in | Wt 156.8 lb

## 2016-10-08 DIAGNOSIS — M47816 Spondylosis without myelopathy or radiculopathy, lumbar region: Secondary | ICD-10-CM | POA: Diagnosis not present

## 2016-10-08 DIAGNOSIS — M5136 Other intervertebral disc degeneration, lumbar region: Secondary | ICD-10-CM | POA: Diagnosis not present

## 2016-10-08 DIAGNOSIS — M5441 Lumbago with sciatica, right side: Secondary | ICD-10-CM

## 2016-10-08 DIAGNOSIS — G8929 Other chronic pain: Secondary | ICD-10-CM

## 2016-10-08 MED ORDER — OXYCODONE-ACETAMINOPHEN 5-325 MG PO TABS: 1.0000 | ORAL_TABLET | Freq: Four times a day (QID) | ORAL | 0 refills | Status: AC | PRN

## 2016-10-08 MED ORDER — CYCLOBENZAPRINE HCL 10 MG PO TABS: 10.0000 mg | ORAL_TABLET | Freq: Three times a day (TID) | ORAL | 1 refills | Status: AC | PRN

## 2016-10-08 NOTE — Progress Notes (Signed)
OFFICE VISIT NOTE Amber Gill. [facility] at [facility] [id number]  [name] Amber Gill - 46 y.o. female MRN [id number]  Date of birth: 11/03/1970  Visit Date: 10/08/2016  PCP: Barbie Banner, MD   Referred by: Barbie Banner, MD  Clovis Cao, cma acting as scribe for Dr. Berline Chough SUBJECTIVE:   Chief Complaint  Patient presents with  . Review MRI Lumbar Spine   HPI: As below and per problem based documentation when appropriate.   [name] has had chronic bilateral lower back pain with right sided sciatica. MRI has been obtained for further evaluation and treatment. Pt is here to discuss results. Did not get good results with Valium, only took 2 doses. She is taking Flexeril PRN only with some relief.      ROS  Otherwise per HPI.  HISTORY & PERTINENT PRIOR DATA:  No specialty comments available. She reports that she has never smoked. She has never used smokeless tobacco. No results for input(s): HGBA1C, LABURIC in the last 8760 hours. Medications & Allergies reviewed per EMR Patient Active Problem List   Diagnosis Date Noted  . Lumbar spondylosis 10/08/2016  . Degeneration of lumbar intervertebral disc 10/08/2016  . Chronic bilateral low back pain with right-sided sciatica 09/17/2016  . Nonallopathic lesion of lumbosacral region 04/09/2016  . Nonallopathic lesion of thoracic region 04/09/2016  . Nonallopathic lesion of sacral region 04/09/2016  . Left lumbar radiculopathy 04/02/2016  . Allergic rhinitis 08/04/2015  . Asthma 08/04/2015  . Bilateral inguinal hernia 08/04/2015  . Umbilical hernia without obstruction and without gangrene 08/04/2015   Past Medical History:  Diagnosis Date  . Asthma    History reviewed. No pertinent family history. Past Surgical History:  Procedure Laterality Date  . HERNIA REPAIR     Social History   Occupational History  . Not on file.   Social History Main Topics  . Smoking status:  Never Smoker  . Smokeless tobacco: Never Used  . Alcohol use Yes  . Drug use: No  . Sexual activity: Not on file    OBJECTIVE:  VS:  HT:5' 4.5" (163.8 cm)   WT:156 lb 12.8 oz (71.1 kg)  BMI:26.6    BP:110/80  HR:90bpm  TEMP: ( )  RESP:99 % EXAM: Findings:  Adult female.  No acute respiratory distress that she is slightly uncomfortable and stands the majority of the exam.  She is able to heel toe walk without difficulty.  Lower extremity sensation is intact to light touch although she does have a slight dysesthesia in the L3 dermatomal distribution.  This is mild.  Hip flexor strength is normal.  Lower extremity DTRs are 2+/4.    Mr Lumbar Spine Wo Contrast  Result Date: 10/02/2016 CLINICAL DATA:  Initial evaluation for chronic lower back pain with right-sided sciatica. EXAM: MRI LUMBAR SPINE WITHOUT CONTRAST TECHNIQUE: Multiplanar, multisequence MR imaging of the lumbar spine was performed. No intravenous contrast was administered. COMPARISON:  Prior radiograph from 03/29/2016. FINDINGS: Segmentation: Normal segmentation. Lowest well-formed disc is labeled the L5-S1 level. Alignment: Vertebral bodies normally aligned with preservation of the normal lumbar lordosis. No listhesis. Vertebrae: Vertebral body heights are well maintained. No evidence for acute or chronic fracture. Signal intensity within the vertebral body bone marrow is within normal limits. Few small benign hemangiomas noted. No worrisome osseous lesion. No abnormal marrow edema. Conus medullaris: Extends to the L1 level and appears normal. Paraspinal and other soft tissues: Paraspinous soft tissues within normal  limits. Visualized visceral structures are normal. Disc levels: No significant degenerative changes are seen through the L2-3 level. L3-4: Circumferential disc bulge with disc desiccation and intervertebral disc space narrowing. Superimposed shallow left central disc protrusion mildly indents the ventral thecal sac (series  6, image 22). No significant stenosis or evidence for neural impingement. Mild left L3 foraminal narrowing. L4-5: Normal interspace. Mild facet and ligamentum flavum hypertrophy. No significant canal or neural foraminal stenosis. L5-S1: Shallow posterior disc bulge with associated annular fissure. No stenosis or neural impingement. Mild facet hypertrophy. Foramina widely patent. IMPRESSION: 1. Shallow left paracentral disc protrusion at L3-4 without significant stenosis or neural impingement. 2. Shallow posterior disc bulge at L5-S1 without stenosis or neural impingement. 3. Mild bilateral facet hypertrophy at L4-5 and L5-S1. Electronically Signed   By: Rise MuBenjamin  McClintock M.D.   On: 10/02/2016 22:38   ASSESSMENT & PLAN:     ICD-10-CM   1. Chronic bilateral low back pain with right-sided sciatica M54.41 cyclobenzaprine (FLEXERIL) 10 MG tablet   G89.29 oxyCODONE-acetaminophen (PERCOCET) 5-325 MG tablet    Ambulatory referral to Physical Therapy  2. Lumbar spondylosis M47.816 Ambulatory referral to Physical Therapy  3. Degeneration of lumbar intervertebral disc M51.36 Ambulatory referral to Physical Therapy  ================================================================= Lumbar spondylosis I suspect the majority of the symptoms she is experiencing are coming from her underlying degenerative changes mainly in that she would benefit from spinal stabilization form of therapy program.  Referral to physical therapy placed today.  Additionally given the degenerative changes I suspect she is having some radicular component in the setting of underlying trigger points and will likely benefit from epidural steroid injection.  However she would like to hold off on this at this time and she will call us if she is interested.  Formal physical therapy referral placed    Chronic bilateral low back pain with right-sided sciatica Given the degenerative changes in her back and the significant symptoms that she does  experience with this a small prescription for Percocet was provided to be used only intermittently.  We did discuss the ascending pain theory and how that even in small doses can affect this. ================================================================= Follow-up: Return in about 8 weeks (around 12/03/2016).   CMA/ATC served as Neurosurgeonscribe during this visit. History, Physical, and Plan performed by medical provider. Documentation and orders reviewed and attested to.      Gaspar BiddingMichael Xiamara Hulet, DO    [facility]

## 2016-10-08 NOTE — Patient Instructions (Signed)
Also check out the YouTube Video from Dr. Eric Goodman.  I would like to see you try performing this 5-6 days per week.    A good intro video is: "Independence from Pain 7-minute Video" - https://www.youtube.com/watch?[email or web address]   His more advanced video is: "Powerful Posture and Pain Relief: 12 minutes of Foundation Training" - https://youtu.be/4BOTvaRaDjI   Do not try to attempt this entire video when first beginning.    Try breaking of each exercise that he goes into shorter segments.  Otherwise if they perform an exercise for 45 seconds, start with 15 seconds and rest and then resume with a begin the new activity.  Work your way up to doing this 12 minute video and I expect to see significant improvements in your pain.  

## 2016-10-12 NOTE — Progress Notes (Signed)
This encounter was created in error - please disregard.

## 2016-10-14 NOTE — Assessment & Plan Note (Addendum)
I suspect the majority of the symptoms she is experiencing are coming from her underlying degenerative changes mainly in that she would benefit from spinal stabilization form of therapy program.  Referral to physical therapy placed today.  Additionally given the degenerative changes I suspect she is having some radicular component in the setting of underlying trigger points and will likely benefit from epidural steroid injection.  However she would like to hold off on this at this time and she will call us if she is interested.  Formal physical therapy referral placed

## 2016-10-14 NOTE — Assessment & Plan Note (Signed)
Given the degenerative changes in her back and the significant symptoms that she does experience with this a small prescription for Percocet was provided to be used only intermittently.  We did discuss the ascending pain theory and how that even in small doses can affect this.

## 2016-11-06 DIAGNOSIS — M5136 Other intervertebral disc degeneration, lumbar region: Secondary | ICD-10-CM | POA: Diagnosis not present

## 2016-11-06 DIAGNOSIS — G8929 Other chronic pain: Secondary | ICD-10-CM | POA: Diagnosis not present

## 2016-11-06 DIAGNOSIS — M5441 Lumbago with sciatica, right side: Secondary | ICD-10-CM | POA: Diagnosis not present

## 2016-11-06 DIAGNOSIS — M47816 Spondylosis without myelopathy or radiculopathy, lumbar region: Secondary | ICD-10-CM | POA: Diagnosis not present

## 2016-11-13 DIAGNOSIS — M5136 Other intervertebral disc degeneration, lumbar region: Secondary | ICD-10-CM | POA: Diagnosis not present

## 2016-11-13 DIAGNOSIS — M47816 Spondylosis without myelopathy or radiculopathy, lumbar region: Secondary | ICD-10-CM | POA: Diagnosis not present

## 2016-11-13 DIAGNOSIS — G8929 Other chronic pain: Secondary | ICD-10-CM | POA: Diagnosis not present

## 2016-11-13 DIAGNOSIS — M5441 Lumbago with sciatica, right side: Secondary | ICD-10-CM | POA: Diagnosis not present

## 2017-12-11 DIAGNOSIS — Z23 Encounter for immunization: Secondary | ICD-10-CM | POA: Diagnosis not present

## 2018-07-16 DIAGNOSIS — J4521 Mild intermittent asthma with (acute) exacerbation: Secondary | ICD-10-CM | POA: Diagnosis not present

## 2018-07-17 DIAGNOSIS — J45909 Unspecified asthma, uncomplicated: Secondary | ICD-10-CM | POA: Diagnosis not present

## 2018-10-14 DIAGNOSIS — J4530 Mild persistent asthma, uncomplicated: Secondary | ICD-10-CM | POA: Diagnosis not present

## 2018-10-14 DIAGNOSIS — Z7189 Other specified counseling: Secondary | ICD-10-CM | POA: Diagnosis not present

## 2018-10-14 DIAGNOSIS — Z20828 Contact with and (suspected) exposure to other viral communicable diseases: Secondary | ICD-10-CM | POA: Diagnosis not present

## 2018-10-14 DIAGNOSIS — R5383 Other fatigue: Secondary | ICD-10-CM | POA: Diagnosis not present

## 2018-10-14 DIAGNOSIS — J4531 Mild persistent asthma with (acute) exacerbation: Secondary | ICD-10-CM | POA: Diagnosis not present

## 2018-10-14 DIAGNOSIS — R05 Cough: Secondary | ICD-10-CM | POA: Diagnosis not present

## 2018-12-31 ENCOUNTER — Other Ambulatory Visit: Payer: Self-pay

## 2018-12-31 ENCOUNTER — Other Ambulatory Visit: Payer: Self-pay | Admitting: Occupational Medicine

## 2018-12-31 ENCOUNTER — Ambulatory Visit: Payer: Self-pay

## 2018-12-31 DIAGNOSIS — M25572 Pain in left ankle and joints of left foot: Secondary | ICD-10-CM

## 2019-01-07 DIAGNOSIS — Z20828 Contact with and (suspected) exposure to other viral communicable diseases: Secondary | ICD-10-CM | POA: Diagnosis not present

## 2019-01-07 DIAGNOSIS — J301 Allergic rhinitis due to pollen: Secondary | ICD-10-CM | POA: Diagnosis not present

## 2019-01-07 DIAGNOSIS — J309 Allergic rhinitis, unspecified: Secondary | ICD-10-CM | POA: Diagnosis not present

## 2019-02-11 ENCOUNTER — Other Ambulatory Visit: Payer: Self-pay | Admitting: Family Medicine

## 2019-02-11 ENCOUNTER — Other Ambulatory Visit: Payer: Self-pay

## 2019-02-11 ENCOUNTER — Ambulatory Visit: Payer: Self-pay

## 2019-02-11 DIAGNOSIS — M25572 Pain in left ankle and joints of left foot: Secondary | ICD-10-CM

## 2019-05-05 DIAGNOSIS — M5416 Radiculopathy, lumbar region: Secondary | ICD-10-CM | POA: Diagnosis not present

## 2019-05-05 DIAGNOSIS — M5440 Lumbago with sciatica, unspecified side: Secondary | ICD-10-CM | POA: Diagnosis not present

## 2019-05-05 DIAGNOSIS — J4530 Mild persistent asthma, uncomplicated: Secondary | ICD-10-CM | POA: Diagnosis not present

## 2019-08-26 DIAGNOSIS — R300 Dysuria: Secondary | ICD-10-CM | POA: Diagnosis not present

## 2019-12-17 IMAGING — DX DG ANKLE COMPLETE 3+V*L*
3 series · 3 of 3 positions shown · non-contrast
Comparison: 12/31/2018

CLINICAL DATA: Ankle pain

EXAM:
LEFT ANKLE COMPLETE - 3+ VIEW

[ankle ap]
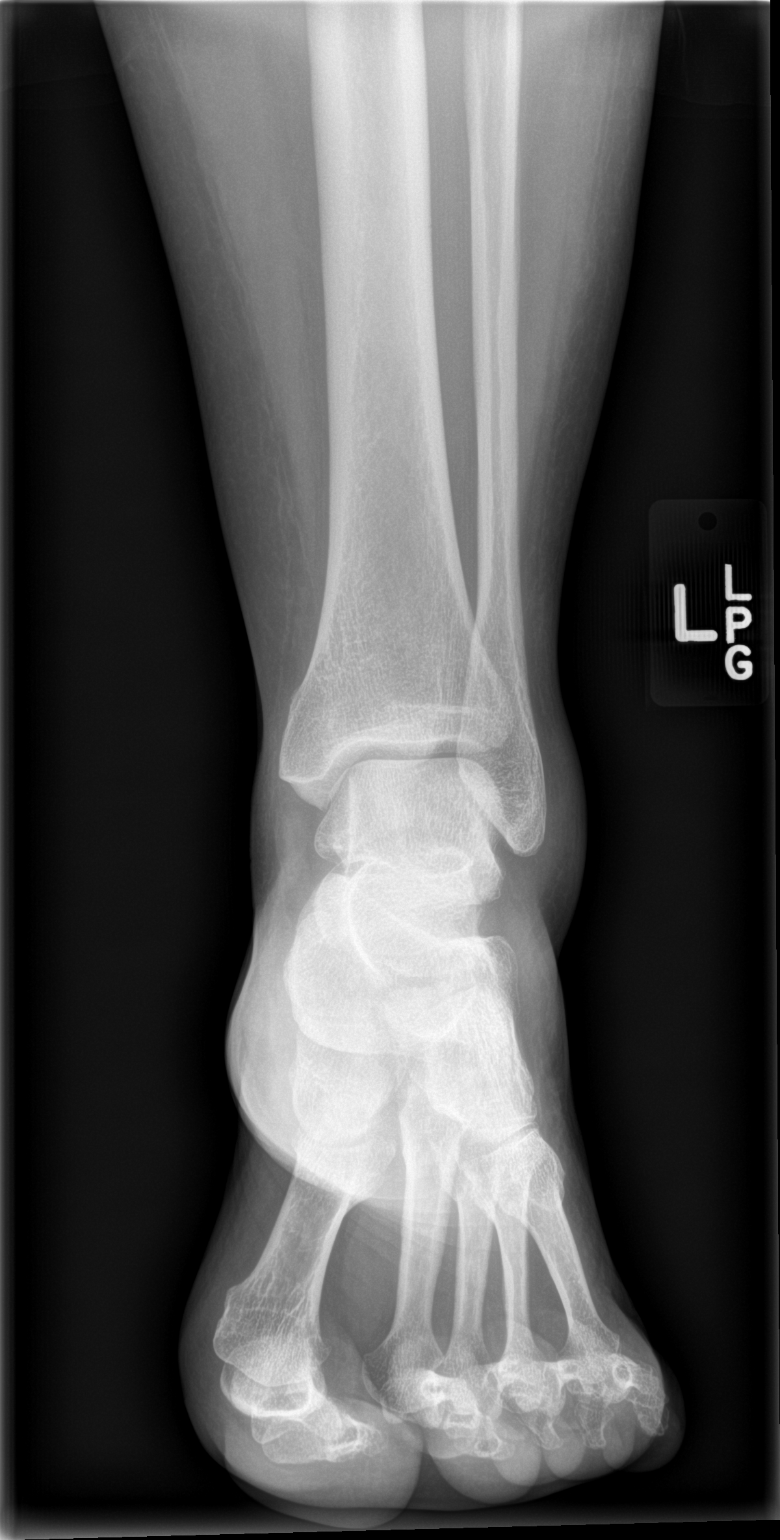

[ankle mortise]
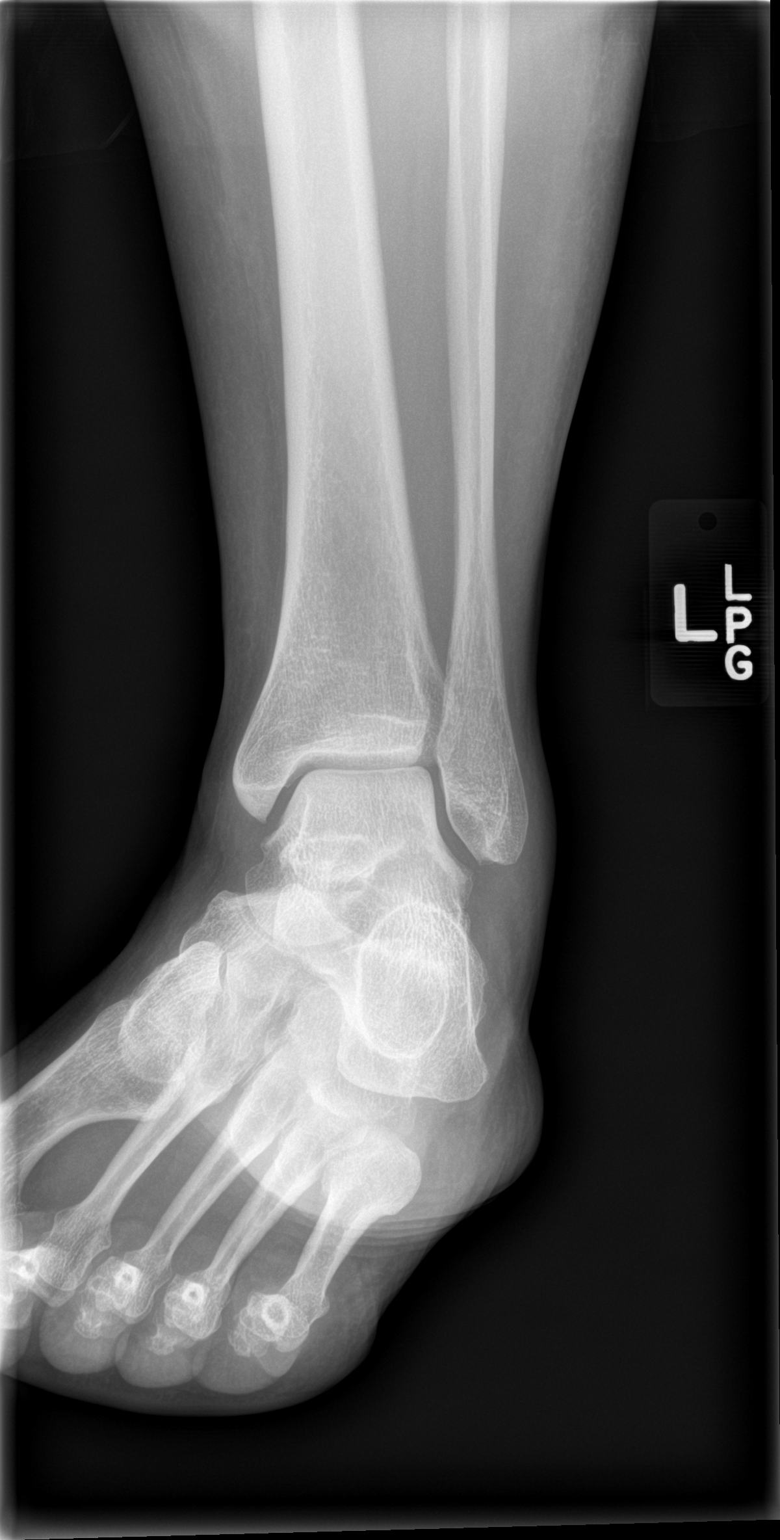

[ankle lat]
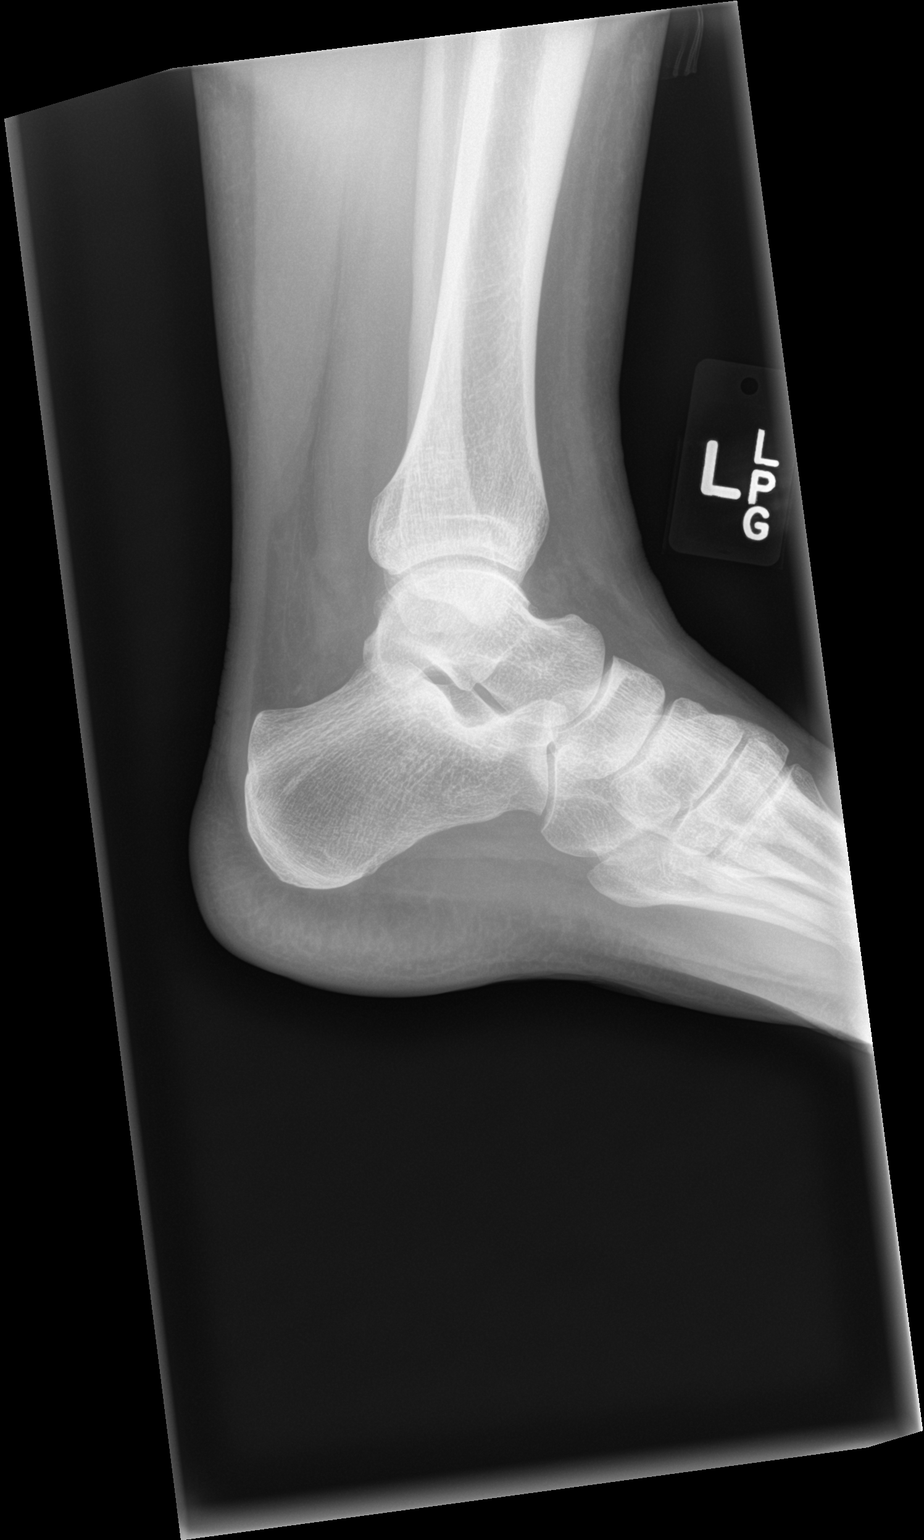

[3 of 3 positions shown; findings below may reference images not displayed]

FINDINGS: No fracture or malalignment. Ankle mortise is symmetric. Soft tissue
swelling is present
IMPRESSION: No acute osseous abnormality

## 2020-01-22 DIAGNOSIS — Z20822 Contact with and (suspected) exposure to covid-19: Secondary | ICD-10-CM | POA: Diagnosis not present

## 2020-01-22 DIAGNOSIS — J029 Acute pharyngitis, unspecified: Secondary | ICD-10-CM | POA: Diagnosis not present

## 2020-05-07 ENCOUNTER — Emergency Department (HOSPITAL_COMMUNITY)
Admission: EM | Admit: 2020-05-07 | Discharge: 2020-05-07 | Disposition: A | Discharge status: Home / Self Care | Payer: PRIVATE HEALTH INSURANCE | Attending: Emergency Medicine | Admitting: Emergency Medicine

## 2020-05-07 ENCOUNTER — Encounter (HOSPITAL_COMMUNITY): Payer: Self-pay | Admitting: Emergency Medicine

## 2020-05-07 ENCOUNTER — Emergency Department (HOSPITAL_COMMUNITY): Payer: PRIVATE HEALTH INSURANCE

## 2020-05-07 DIAGNOSIS — Z79899 Other long term (current) drug therapy: Secondary | ICD-10-CM | POA: Diagnosis not present

## 2020-05-07 DIAGNOSIS — Y9301 Activity, walking, marching and hiking: Secondary | ICD-10-CM | POA: Diagnosis not present

## 2020-05-07 DIAGNOSIS — S8265XA Nondisplaced fracture of lateral malleolus of left fibula, initial encounter for closed fracture: Secondary | ICD-10-CM | POA: Diagnosis not present

## 2020-05-07 DIAGNOSIS — Y990 Civilian activity done for income or pay: Secondary | ICD-10-CM | POA: Diagnosis not present

## 2020-05-07 DIAGNOSIS — S99912A Unspecified injury of left ankle, initial encounter: Secondary | ICD-10-CM | POA: Diagnosis present

## 2020-05-07 DIAGNOSIS — J45909 Unspecified asthma, uncomplicated: Secondary | ICD-10-CM | POA: Diagnosis not present

## 2020-05-07 DIAGNOSIS — X501XXA Overexertion from prolonged static or awkward postures, initial encounter: Secondary | ICD-10-CM | POA: Diagnosis not present

## 2020-05-07 MED ORDER — ACETAMINOPHEN 500 MG PO TABS
1000.0000 mg | ORAL_TABLET | Freq: Once | ORAL | Status: AC
  Administered 2020-05-07: 1000 mg via ORAL
  Filled 2020-05-07: qty 2

## 2020-05-07 NOTE — ED Notes (Signed)
Ortho tech at bedside 

## 2020-05-07 NOTE — ED Triage Notes (Signed)
Pt. Stated, walking into work and tripped over a rock, twisted the left ankle and landed on rt. Knee.

## 2020-05-07 NOTE — ED Notes (Signed)
Patient Alert and oriented to baseline. Stable and ambulatory to baseline. Patient verbalized understanding of the discharge instructions.  Patient belongings were taken by the patient.   

## 2020-05-07 NOTE — ED Provider Notes (Signed)
[facility] EMERGENCY DEPARTMENT Provider Note   CSN: [id number] Arrival date & time: 05/07/20  6160     History Chief Complaint  Patient presents with  . Ankle Pain  . Leg Injury    Amber Gill is a 50 y.o. female with past medical history who presents for evaluation of ankle pain.  Patient was walking into work when she tripped over a rock and inverted her left ankle.  Had noted pain and swelling.  She denies hitting her head, LOC.  Rates her pain a 4/10.  Did have some mild tingling to her toes.  Denies pain to midshaft, proximal tib-fib, knees.  Denies additional aggravating or alleviating factors.  History obtained from patient and past medical records.  No interpreter used.  HPI     Past Medical History:  Diagnosis Date  . Asthma     Patient Active Problem List   Diagnosis Date Noted  . Lumbar spondylosis 10/08/2016  . Degeneration of lumbar intervertebral disc 10/08/2016  . Chronic bilateral low back pain with right-sided sciatica 09/17/2016  . Nonallopathic lesion of lumbosacral region 04/09/2016  . Nonallopathic lesion of thoracic region 04/09/2016  . Nonallopathic lesion of sacral region 04/09/2016  . Left lumbar radiculopathy 04/02/2016  . Allergic rhinitis 08/04/2015  . Asthma 08/04/2015  . Bilateral inguinal hernia 08/04/2015  . Umbilical hernia without obstruction and without gangrene 08/04/2015    Past Surgical History:  Procedure Laterality Date  . HERNIA REPAIR       OB History   No obstetric history on file.     No family history on file.  Social History   Tobacco Use  . Smoking status: Never Smoker  . Smokeless tobacco: Never Used  Vaping Use  . Vaping Use: Never used  Substance Use Topics  . Alcohol use: Yes  . Drug use: No    Home Medications Prior to Admission medications   Medication Sig Start Date End Date Taking? Authorizing Provider  acetaminophen (TYLENOL) 325 MG tablet Take 650 mg by mouth daily as  needed (pain).    [provider]  albuterol (PROVENTIL HFA;VENTOLIN HFA) 108 (90 BASE) MCG/ACT inhaler Inhale 2 puffs into the lungs 4 (four) times daily as needed for wheezing.    [provider]  cyclobenzaprine (FLEXERIL) 10 MG tablet Take 1 tablet (10 mg total) by mouth 3 (three) times daily as needed for muscle spasms. 10/08/16   Andrena Mews, DO  oxyCODONE-acetaminophen (PERCOCET) 5-325 MG tablet Take 1 tablet by mouth every 6 (six) hours as needed for severe pain. 10/08/16   Andrena Mews, DO  Vitamin D, Ergocalciferol, (DRISDOL) 50000 units CAPS capsule Take 1 capsule (50,000 Units total) by mouth every 7 (seven) days. 04/09/16   Judi Saa, DO    Allergies    Casein, Gluten meal, Milk-related compounds, Nsaids, Oat, and Ceclor [cefaclor]  Review of Systems   Review of Systems  Constitutional: Negative.   HENT: Negative.   Respiratory: Negative.   Cardiovascular: Negative.   Gastrointestinal: Negative.   Genitourinary: Negative.   Musculoskeletal: Positive for gait problem.       Left ankle pain  Skin: Negative.   All other systems reviewed and are negative.   Physical Exam Updated Vital Signs BP (!) 138/91   Pulse 70   Temp 97.6 F (36.4 C) (Oral)   Resp 18   Wt 74.8 kg   SpO2 100%   BMI 27.88 kg/m   Physical Exam Vitals and  nursing note reviewed.  Constitutional:      General: She is not in acute distress.    Appearance: She is well-developed and well-nourished. She is not ill-appearing, toxic-appearing or diaphoretic.  HENT:     Head: Atraumatic.  Cardiovascular:     Rate and Rhythm: Normal rate and regular rhythm.     Pulses: Intact distal pulses.          Dorsalis pedis pulses are 2+ on the left side.  Pulmonary:     Effort: Pulmonary effort is normal. No respiratory distress.  Abdominal:     General: There is no distension.  Musculoskeletal:     Cervical back: Normal range of motion and neck supple.     Comments: No bony  tenderness to knees, midshaft, proximal tib-fib.  Tenderness to left lateral malleolus with some overlying soft tissue swelling.  No bony tenderness of foot.  Skin:    General: Skin is warm and dry.     Capillary Refill: Capillary refill takes less than 2 seconds.     Comments: Soft tissue swelling to left lateral malleolus  Neurological:     General: No focal deficit present.     Mental Status: She is alert.     Comments: Intact sensation  Psychiatric:        Mood and Affect: Mood and affect normal.     ED Results / Procedures / Treatments   Labs (all labs ordered are listed, but only abnormal results are displayed) Labs Reviewed - No data to display  EKG None  Radiology DG Ankle Complete Left  Result Date: 05/07/2020 CLINICAL DATA:  50 year old female status post trip with twisting injury. Lateral pain. EXAM: LEFT ANKLE COMPLETE - 3+ VIEW COMPARISON:  Left ankle series 02/11/2019. FINDINGS: Transverse fracture through the tip of the lateral malleolus, minimally displaced. Mortise joint alignment maintained. Talar dome intact. Distal tibia appears stable and intact. Lateral and anterior soft tissue swelling. Questionable small joint effusion. Calcaneus and other visible bones of the left foot appear intact. Bone mineralization is within normal limits. IMPRESSION: Transverse minimally displaced fracture at the tip of the left lateral malleolus. Electronically Signed   By: Odessa Fleming M.D.   On: 05/07/2020 07:44    Procedures .Splint Application  Date/Time: 05/07/2020 9:01 AM Performed by: Linwood Dibbles, PA-C Authorized by: Linwood Dibbles, PA-C   Consent:    Consent obtained:  Verbal   Consent given by:  Patient   Risks, benefits, and alternatives were discussed: yes     Risks discussed:  Discoloration, pain, swelling and numbness   Alternatives discussed:  Referral, observation, alternative treatment, delayed treatment and no treatment Universal protocol:    Procedure  explained and questions answered to patient or proxy's satisfaction: yes     Relevant documents present and verified: yes     Test results available: yes     Imaging studies available: yes     Required blood products, implants, devices, and special equipment available: yes     Site/side marked: yes     Immediately prior to procedure a time out was called: yes     Patient identity confirmed:  Verbally with patient Pre-procedure details:    Distal neurologic exam:  Normal   Distal perfusion: distal pulses strong and brisk capillary refill   Procedure details:    Location:  Ankle   Ankle location:  L ankle   Lower extremity splint type:  CAM walker. Post-procedure details:    Distal neurologic exam:  Normal   Distal perfusion: unchanged     Procedure completion:  Tolerated well, no immediate complications   Post-procedure imaging: not applicable       Medications Ordered in ED Medications  acetaminophen (TYLENOL) tablet 1,000 mg (1,000 mg Oral Given 05/07/20 2458)    ED Course  I have reviewed the triage vital signs and the nursing notes.  Pertinent labs & imaging results that were available during my care of the patient were reviewed by me and considered in my medical decision making (see chart for details).  Patient with trip and fall as she was walking into work, subsequently inverting her left ankle.  She is neurovascularly intact.  Does have some soft tissue swelling to her left lateral malleolus.  No bony tenderness to midshaft, proximal tib-fib, distal foot.  X-rays obtained which I personally reviewed which shows lateral malleoli fracture.   CAM walker applied.  Patient has crutches at home.  RICE for symptomatic management.  She will follow-up with orthopedics.   The patient has been appropriately medically screened and/or stabilized in the ED. I have low suspicion for any other emergent medical condition which would require further screening, evaluation or treatment in the  ED or require inpatient management.  Patient is hemodynamically stable and in no acute distress.  Patient able to ambulate in department prior to ED.  Evaluation does not show acute pathology that would require ongoing or additional emergent interventions while in the emergency department or further inpatient treatment.  I have discussed the diagnosis with the patient and answered all questions.  Pain is been managed while in the emergency department and patient has no further complaints prior to discharge.  Patient is comfortable with plan discussed in room and is stable for discharge at this time.  I have discussed strict return precautions for returning to the emergency department.  Patient was encouraged to follow-up with PCP/specialist refer to at discharge.    MDM Rules/Calculators/A&P                           Final Clinical Impression(s) / ED Diagnoses Final diagnoses:  Closed nondisplaced fracture of lateral malleolus of left fibula, initial encounter    Rx / DC Orders ED Discharge Orders    None       Juri Dinning A, PA-C 05/07/20 0902    Jacalyn Lefevre, MD 05/07/20 1021

## 2020-05-07 NOTE — Progress Notes (Signed)
Orthopedic Tech Progress Note Patient Details:  Amber Gill [birth year] [id number]  Ortho Devices Type of Ortho Device: CAM walker Ortho Device/Splint Location: Left Lower Extremity Ortho Device/Splint Interventions: Ordered,Application,Adjustment   Post Interventions Patient Tolerated: Well Instructions Provided: Adjustment of device,Care of device,Poper ambulation with device   Amber Gill 05/07/2020, 8:54 AM

## 2020-05-07 NOTE — Discharge Instructions (Signed)
Follow up with Orthopedics within the week. If you do not have an Orthopedist I have placed the contact information of one in your discharge instructions.  Try to Ice and elevated the ankle  I have written you out of work however if you are feeling better you may return sooner.  Return for new or worsening symptoms

## 2020-09-29 ENCOUNTER — Other Ambulatory Visit (HOSPITAL_COMMUNITY): Payer: Self-pay | Admitting: Orthopaedic Surgery

## 2020-09-29 DIAGNOSIS — M25572 Pain in left ankle and joints of left foot: Secondary | ICD-10-CM

## 2020-10-07 ENCOUNTER — Ambulatory Visit (HOSPITAL_COMMUNITY)
Admission: RE | Admit: 2020-10-07 | Discharge: 2020-10-07 | Disposition: A | Discharge status: Home / Self Care | Payer: PRIVATE HEALTH INSURANCE | Source: Ambulatory Visit | Attending: Orthopaedic Surgery | Admitting: Orthopaedic Surgery

## 2020-10-07 ENCOUNTER — Other Ambulatory Visit: Payer: Self-pay

## 2020-10-07 DIAGNOSIS — M25572 Pain in left ankle and joints of left foot: Secondary | ICD-10-CM | POA: Insufficient documentation
